# Patient Record
Sex: Male | Born: 1935 | Race: White | Hispanic: No | Smoking: Former smoker
Health system: Southern US, Community
[De-identification: ages and names within clinical notes are randomized; demographics above are authoritative.]

## PROBLEM LIST (undated history)

## (undated) HISTORY — PX: CHOLECYSTECTOMY: SHX55

---

## 2002-06-12 ENCOUNTER — Inpatient Hospital Stay (HOSPITAL_COMMUNITY): Admission: RE | Admit: 2002-06-12 | Discharge: 2002-06-12 | Payer: Self-pay | Admitting: Neurosurgery

## 2002-06-12 ENCOUNTER — Encounter: Payer: Self-pay | Admitting: Neurosurgery

## 2008-11-03 DIAGNOSIS — C4492 Squamous cell carcinoma of skin, unspecified: Secondary | ICD-10-CM

## 2008-11-03 HISTORY — DX: Squamous cell carcinoma of skin, unspecified: C44.92

## 2010-05-10 ENCOUNTER — Ambulatory Visit: Payer: Self-pay | Admitting: Internal Medicine

## 2010-05-19 ENCOUNTER — Ambulatory Visit: Payer: Self-pay | Admitting: Internal Medicine

## 2011-07-15 ENCOUNTER — Inpatient Hospital Stay: Payer: Self-pay | Admitting: Family Medicine

## 2011-07-15 LAB — COMPREHENSIVE METABOLIC PANEL
Alkaline Phosphatase: 80 U/L (ref 50–136)
BUN: 21 mg/dL — ABNORMAL HIGH (ref 7–18)
Bilirubin,Total: 0.3 mg/dL (ref 0.2–1.0)
Calcium, Total: 9 mg/dL (ref 8.5–10.1)
Creatinine: 1.09 mg/dL (ref 0.60–1.30)
EGFR (Non-African Amer.): >60
SGPT (ALT): 19 U/L

## 2011-07-15 LAB — URINALYSIS, COMPLETE
Bacteria: NONE SEEN
Bilirubin,UR: NEGATIVE
Blood: NEGATIVE
Glucose,UR: 150 mg/dL (ref 0–75)
Ketone: NEGATIVE
Leukocyte Esterase: NEGATIVE
Ph: 5 (ref 4.5–8.0)
Squamous Epithelial: <1

## 2011-07-16 LAB — CBC WITH DIFFERENTIAL/PLATELET
Basophil #: 0 10*3/uL (ref 0.0–0.1)
Eosinophil %: 0.9 %
Lymphocyte #: 0.8 10*3/uL — ABNORMAL LOW (ref 1.0–3.6)
Lymphocyte %: 25.2 %
MCV: 87 fL (ref 80–100)
Monocyte %: 7.9 %
Neutrophil %: 65.6 %
Platelet: 91 10*3/uL — ABNORMAL LOW (ref 150–440)
RDW: 13.6 % (ref 11.5–14.5)
WBC: 3.3 10*3/uL — ABNORMAL LOW (ref 3.8–10.6)

## 2011-07-16 LAB — BASIC METABOLIC PANEL
BUN: 15 mg/dL (ref 7–18)
Calcium, Total: 8.2 mg/dL — ABNORMAL LOW (ref 8.5–10.1)
EGFR (African American): >60
EGFR (Non-African Amer.): >60
Osmolality: 290 (ref 275–301)
Potassium: 3.8 mmol/L (ref 3.5–5.1)

## 2011-07-16 LAB — HEMOGLOBIN: HGB: 8.6 g/dL — ABNORMAL LOW (ref 13.0–18.0)

## 2011-07-17 LAB — CBC WITH DIFFERENTIAL/PLATELET
Basophil #: 0 10*3/uL (ref 0.0–0.1)
Eosinophil #: 0.1 10*3/uL (ref 0.0–0.7)
Lymphocyte %: 20.9 %
MCHC: 34.5 g/dL (ref 32.0–36.0)
Monocyte %: 7.2 %
Platelet: 101 10*3/uL — ABNORMAL LOW (ref 150–440)
RBC: 3.43 10*6/uL — ABNORMAL LOW (ref 4.40–5.90)
RDW: 13.8 % (ref 11.5–14.5)

## 2011-07-17 LAB — BASIC METABOLIC PANEL
BUN: 9 mg/dL (ref 7–18)
Calcium, Total: 8.3 mg/dL — ABNORMAL LOW (ref 8.5–10.1)
EGFR (African American): >60
EGFR (Non-African Amer.): >60
Glucose: 123 mg/dL — ABNORMAL HIGH (ref 65–99)
Osmolality: 287 (ref 275–301)
Potassium: 3.8 mmol/L (ref 3.5–5.1)

## 2011-07-18 LAB — CBC WITH DIFFERENTIAL/PLATELET
Basophil #: 0 10*3/uL (ref 0.0–0.1)
Basophil %: 0.4 %
Eosinophil #: 0.1 10*3/uL (ref 0.0–0.7)
Eosinophil %: 2 %
HCT: 28.8 % — ABNORMAL LOW (ref 40.0–52.0)
HGB: 9.9 g/dL — ABNORMAL LOW (ref 13.0–18.0)
Lymphocyte #: 0.8 10*3/uL — ABNORMAL LOW (ref 1.0–3.6)
MCH: 29.9 pg (ref 26.0–34.0)
MCV: 87 fL (ref 80–100)
Monocyte %: 9.2 %
Neutrophil #: 2.4 10*3/uL (ref 1.4–6.5)
RBC: 3.3 10*6/uL — ABNORMAL LOW (ref 4.40–5.90)
WBC: 3.6 10*3/uL — ABNORMAL LOW (ref 3.8–10.6)

## 2011-07-19 LAB — CBC WITH DIFFERENTIAL/PLATELET
Basophil #: 0 10*3/uL (ref 0.0–0.1)
Basophil %: 0.6 %
Eosinophil %: 1.9 %
HCT: 27.1 % — ABNORMAL LOW (ref 40.0–52.0)
HGB: 9 g/dL — ABNORMAL LOW (ref 13.0–18.0)
Lymphocyte %: 23.2 %
MCH: 29.1 pg (ref 26.0–34.0)
MCHC: 33.3 g/dL (ref 32.0–36.0)
MCV: 87 fL (ref 80–100)
Monocyte #: 0.4 x10 3/mm (ref 0.2–1.0)
RDW: 14.2 % (ref 11.5–14.5)
WBC: 3.9 10*3/uL (ref 3.8–10.6)

## 2011-07-19 LAB — BASIC METABOLIC PANEL
Anion Gap: 7 (ref 7–16)
BUN: 10 mg/dL (ref 7–18)
Co2: 27 mmol/L (ref 21–32)
Creatinine: 1.23 mg/dL (ref 0.60–1.30)
EGFR (African American): >60
Osmolality: 289 (ref 275–301)

## 2011-07-20 LAB — CBC WITH DIFFERENTIAL/PLATELET
Basophil #: 0 10*3/uL (ref 0.0–0.1)
Basophil %: 0.4 %
Eosinophil #: 0.1 10*3/uL (ref 0.0–0.7)
HGB: 9.9 g/dL — ABNORMAL LOW (ref 13.0–18.0)
Lymphocyte %: 24.6 %
MCHC: 35 g/dL (ref 32.0–36.0)
MCV: 86 fL (ref 80–100)
Monocyte %: 9.4 %
Neutrophil #: 2.7 10*3/uL (ref 1.4–6.5)
Neutrophil %: 63.8 %
Platelet: 107 10*3/uL — ABNORMAL LOW (ref 150–440)
RBC: 3.27 10*6/uL — ABNORMAL LOW (ref 4.40–5.90)
WBC: 4.2 10*3/uL (ref 3.8–10.6)

## 2012-11-05 DIAGNOSIS — C4491 Basal cell carcinoma of skin, unspecified: Secondary | ICD-10-CM

## 2012-11-05 HISTORY — DX: Basal cell carcinoma of skin, unspecified: C44.91

## 2014-04-22 NOTE — Discharge Summary (Signed)
PATIENT NAME:  Vincent Church, Vincent Church MR#:  [id number] DATE OF BIRTH:  May 09, 1931  DATE OF ADMISSION:  07/15/2011 DATE OF DISCHARGE:  07/20/2011  DISCHARGE DIAGNOSES:  1. Upper GI bleed secondary to bleeding ulcer, resolved.  2. Anemia.   DISCHARGE MEDICATIONS:  1. Protonix 40 mg p.o. b.i.d.  2. Ferrous sulfate 325 mg p.o. b.i.d. to obtain over-the-counter.   CONSULT: GI per Dr. Dionne Milo    PROCEDURE: The patient had endoscopy performed with gastric ulcer cauterization per Dr. Dionne Milo.   PERTINENT LABS PRIOR TO DISCHARGE: White blood cell count 4.2, hemoglobin 9.9, platelets 107, MCV of 86.   BRIEF HOSPITAL COURSE: The patient initially came in with melena and anemia. The patient was evaluated by GI, had endoscopy performed which showed ulcer that was bleeding and cauterization was performed. Bleeding has resolved. He was placed on PPIs and has been transitioned to Protonix 40 mg p.o. b.i.d. His anemia has remained stable after the procedure. His hemoglobin upon discharge was 9.9. Will follow-up with GI per Dr. Dionne Milo in four weeks and follow-up with Dr. Doy Hutching in two weeks.   DISPOSITION: Stable condition. He will be discharged to home with follow-up as stated above.   ____________________________ Dion Body, MD kl:drc D: 07/20/2011 07:56:41 ET T: 07/20/2011 12:40:48 ET JOB#: [id number]  cc: Dion Body, MD, <Dictator>, Jill Side, MD, Leonie Douglas. Doy Hutching, MD Dion Body MD ELECTRONICALLY SIGNED 08/04/2011 8:31

## 2014-04-22 NOTE — Consult Note (Signed)
Chief Complaint:   Subjective/Chief Complaint Feels well. No BM.   VITAL SIGNS/ANCILLARY NOTES: **Vital Signs.:   16-Jul-13 13:37   Vital Signs Type Routine   Temperature Temperature (F) 97.7   Celsius 36.5   Temperature Source Oral   Pulse Pulse 80   Respirations Respirations 20   Systolic BP Systolic BP 552   Diastolic BP (mmHg) Diastolic BP (mmHg) 63   Mean BP 76   Pulse Ox % Pulse Ox % 94   Pulse Ox Activity Level  At rest   Oxygen Delivery Room Air/ 21 %   Lab Results: Routine Chem:  16-Jul-13 04:55    Glucose, Serum  116   BUN 10   Creatinine (comp) 1.23   Sodium, Serum 145   Potassium, Serum 3.6   Chloride, Serum  111   CO2, Serum 27   Calcium (Total), Serum  8.4   Anion Gap 7   Osmolality (calc) 289   eGFR (African American) >60   eGFR (Non-African American)  56 (eGFR values <10m/min/1.73 m2 may be an indication of chronic kidney disease (CKD). Calculated eGFR is useful in patients with stable renal function. The eGFR calculation will not be reliable in acutely ill patients when serum creatinine is changing rapidly. It is not useful in  patients on dialysis. The eGFR calculation may not be applicable to patients at the low and high extremes of body sizes, pregnant women, and vegetarians.)  Routine Hem:  16-Jul-13 04:55    WBC (CBC) 3.9   RBC (CBC)  3.10   Hemoglobin (CBC)  9.0   Hematocrit (CBC)  27.1   Platelet Count (CBC)  96   MCV 87   MCH 29.1   MCHC 33.3   RDW 14.2   Neutrophil % 64.7   Lymphocyte % 23.2   Monocyte % 9.6   Eosinophil % 1.9   Basophil % 0.6   Neutrophil # 2.5   Lymphocyte #  0.9   Monocyte # 0.4   Eosinophil # 0.1   Basophil # 0.0 (Result(s) reported on 19 Jul 2011 at 06:11AM.)   Assessment/Plan:  Assessment/Plan:   Assessment GI bleed secondary to gastric ulcer. No signs of active GI bleed.    Plan May go home in am if no signs of active GI bleed. DC on bid PPI and iron replacement. Follow up with e in 4 weeks.    Electronic Signatures: IJill Side(MD)  (Signed 16-Jul-13 18:24)  Authored: Chief Complaint, VITAL SIGNS/ANCILLARY NOTES, Lab Results, Assessment/Plan   Last Updated: 16-Jul-13 18:24 by IJill Side(MD)

## 2014-04-22 NOTE — Consult Note (Signed)
Chief Complaint:   Subjective/Chief Complaint Feels well. No melena.   VITAL SIGNS/ANCILLARY NOTES: **Vital Signs.:   15-Jul-13 04:55   Vital Signs Type Routine   Temperature Temperature (F) 98.3   Celsius 36.8   Temperature Source oral   Pulse Pulse 71   Respirations Respirations 20   Systolic BP Systolic BP 621   Diastolic BP (mmHg) Diastolic BP (mmHg) 68   Mean BP 86   Pulse Ox % Pulse Ox % 96   Pulse Ox Activity Level  At rest   Oxygen Delivery Room Air/ 21 %   Lab Results: Routine Hem:  15-Jul-13 07:25    WBC (CBC)  3.6   RBC (CBC)  3.30   Hemoglobin (CBC)  9.9   Hematocrit (CBC)  28.8   Platelet Count (CBC)  106   MCV 87   MCH 29.9   MCHC 34.3   RDW 13.8   Neutrophil % 65.3   Lymphocyte % 23.1   Monocyte % 9.2   Eosinophil % 2.0   Basophil % 0.4   Neutrophil # 2.4   Lymphocyte #  0.8   Monocyte # 0.3   Eosinophil # 0.1   Basophil # 0.0 (Result(s) reported on 18 Jul 2011 at 08:35AM.)   Assessment/Plan:  Assessment/Plan:   Assessment UGI bleed secondary to duodenal ulcer s/p endoscopic therapy. No signs of bleeding and H and H is stable.    Plan Will advance diet. Probable DC in 24-48 hours if no signs of active bleeding.   Electronic Signatures: Jill Side (MD)  (Signed 15-Jul-13 18:42)  Authored: Chief Complaint, VITAL SIGNS/ANCILLARY NOTES, Lab Results, Assessment/Plan   Last Updated: 15-Jul-13 18:42 by Jill Side (MD)

## 2014-04-27 NOTE — Consult Note (Signed)
Chief Complaint:   Subjective/Chief Complaint EGD done. Single gastric ulcer with visible vessel. Treated with epinephrine injection and treated with heater probe with good results.  Recommendations: Observe for another 48 hours. Clear liquid diet, may advance tomorrow if no signs of active bleeding. Continue PPI infusion. H pylori antibody test ordered. Please treat if positive. Will follow.   Electronic Signatures: Jill Side (MD)  (Signed 14-Jul-13 08:35)  Authored: Chief Complaint   Last Updated: 14-Jul-13 08:35 by Jill Side (MD)

## 2014-04-27 NOTE — Consult Note (Signed)
Brief Consult Note: Diagnosis: GI bleed.   Patient was seen by consultant.   Comments: Melena, resolved. Most likely secondary to NSAID induced gastric mucosal damage. Anemia, stable.  Recommendations: Continue PPI infusion. Clear liquid diet. EGD in am. Further recommendations to follow.  Electronic Signatures: Jill Side (MD)  (Signed 13-Jul-13 13:18)  Authored: Brief Consult Note   Last Updated: 13-Jul-13 13:18 by Jill Side (MD)

## 2014-04-27 NOTE — Consult Note (Signed)
PATIENT NAME:  Vincent Church, Vincent Church MR#:  [id number] DATE OF BIRTH:  October 08, 1931  DATE OF CONSULTATION:  07/17/2011  REFERRING PHYSICIAN:  Fulton Reek, MD  CONSULTING PHYSICIAN:  Jill Side, MD  REASON FOR CONSULTATION: Melena.  HISTORY OF PRESENT ILLNESS: The patient is a 81 year old male with hyperlipidemia, benign prostatic hypertrophy, and borderline diabetes who was in his normal state of health until about two days ago when he noticed dark black stool. According to him he has taken a few Advil over the last few weeks. He was also having some epigastric discomfort. His hemoglobin was found to be low at 9.3 even though his hemoglobin in May of 2013 was normal. He came to the Emergency Room and was subsequently admitted with a diagnosis of upper GI bleed. The patient was evaluated yesterday. He did not have any melena yesterday. His hemoglobin dropped down to 8.3. No hematemesis or vomiting.   PAST MEDICAL HISTORY:  1. History of gastroesophageal reflux disease.  2. Chronic back pain. 3. Benign prostatic hypertrophy. 4. Hyperlipidemia. 5. Diet controlled diabetes.   PAST SURGICAL HISTORY:  1. Tonsillectomy. 2. Lumbar laminectomy. 3. Hydrocele repair. 4. Cholecystectomy.   ALLERGIES: None.   MEDICATIONS AT HOME: He has taken only a few tablets of Advil over the last few weeks and has been using TUMS recently for epigastric discomfort.   SOCIAL HISTORY: Does not smoke or drink.   FAMILY HISTORY: Unremarkable.   REVIEW OF SYSTEMS: Negative except for what is mentioned in the history of present illness.   PHYSICAL EXAMINATION:   GENERAL: Well built male who appears somewhat pale, does not appear to be in any acute distress. He was hemodynamically stable on admission although he was somewhat tachycardic with a heart rate of around 90 to 100.   NECK: Neck veins are flat.   LUNGS: Grossly clear to auscultation bilaterally with fair air entry.   CARDIOVASCULAR: Regular rate and  rhythm. No gallops or murmur.   ABDOMEN: Quite soft and benign. Mild epigastric tenderness was noted. There is no rebound or guarding. No hepatosplenomegaly was noted. No ascites.   EXTREMITIES: No edema.   NEUROLOGIC: Appears to be unremarkable.   LABORATORY, DIAGNOSTIC, AND RADIOLOGICAL DATA: As mentioned above, hemoglobin was 8.6 initially, currently is 10.4. Platelet count is 101 which is slightly lower than normal. White cell count is 3.8. His platelet count has been somewhat low since admission. Serum lipase 239. Electrolytes are fine. Liver enzymes are normal. BUN 21. Creatinine 1.09.   ASSESSMENT AND PLAN: The patient is with a history of nonsteroidal use now presenting with epigastric pain and melena highly consistent with gastric injury from nonsteroidal use. The patient was started on IV PPI. An upper GI endoscopy was recommended with which he agreed. An upper GI endoscopy was done this morning which showed a prepyloric ulcer with a visible vessel. Ulcer was treated with epinephrine injection and heater probe with good results.  1. Will continue to monitor him for at least another 48 hours.  2. Continue IV PPI.  3. Clear liquid diet, will advance gradually.      4. Follow hemoglobin and hematocrit and transfuse if needed.  5. Further recommendations to follow.   ____________________________ Jill Side, MD si:drc D: 07/17/2011 08:40:21 ET T: 07/17/2011 11:03:15 ET JOB#: [id number]  cc: Jill Side, MD, <Dictator> Jill Side MD ELECTRONICALLY SIGNED 08/05/2011 13:22

## 2014-04-27 NOTE — H&P (Signed)
PATIENT NAME:  Vincent Church, Vincent Church Vincent#:  [id number] DATE OF BIRTH:  04/16/1931  DATE OF ADMISSION:  07/15/2011  ADMITTING PHYSICIAN: Gladstone Lighter, MD  PRIMARY CARE PHYSICIAN: Dr. Fulton Reek.     CHIEF COMPLAINT: Melena.  HISTORY OF PRESENT ILLNESS: Vincent Church is a pleasant 81 year old Caucasian male with no significant past medical history other than gastroesophageal reflux problem and some chronic back pain and diet-controlled diabetes mellitus who went to [facility] today because of melenic stools for the last couple of days. Patient denies any bright red blood per rectum. He has taken a few Advil over the last week secondary to back pain, which he normally does not do. He has also been having some epigastric discomfort with right upper quadrant pain over the last week. He has not had prior episodes of melena. His hemoglobin done today at the walk-in clinic was 9.3 whereas his hemoglobin was 13 in May 2013 from old labs. He has not had any more bloody stools today and presents to the ER. Because of the drop in hemoglobin with melenic stools, he is being admitted.   PAST MEDICAL HISTORY:  1. Gastroesophageal reflux disease with history of esophageal spasms.  2. Chronic back pain.  3. Benign prostatic hypertrophy.  4. Hyperlipidemia.  5. Borderline diet-controlled diabetes mellitus.   PAST SURGICAL HISTORY:  1. Tonsillectomy.  2. Lumbar laminectomy.  3. Hydrocele repair.  4. Cholecystectomy.   ALLERGIES: No known drug allergies.   MEDICATIONS AT HOME: Does not take any medications at all at home.  He just took 2 Tums over the last couple of days for epigastric discomfort and took 4 tablets of Advil in the last week for back pain.   SOCIAL HISTORY: Lives at home by himself. No history of any alcohol or smoking.   FAMILY HISTORY: Father died at age 73 probably from heart disease, and mother died in her 24s.  She has history of breast cancer.  REVIEW OF SYSTEMS: CONSTITUTIONAL:  No fever, fatigue, or weakness. EYES: No blurred vision, double vision, glaucoma or cataracts. ENT: No tinnitus, ear pain, epistaxis or discharge. RESPIRATORY: No cough, wheeze, hemoptysis, or chronic obstructive pulmonary disease. CARDIOVASCULAR: No chest pain, orthopnea, arrhythmia, palpitations, or syncope.  GI: No nausea, vomiting, diarrhea, abdominal pain. No hematemesis. Positive for melena. GENITOURINARY: No dysuria, hematuria, renal calculus or urine incontinence. ENDOCRINE: No polyuria, nocturia, thyroid problems, heat or cold intolerance. HEMATOLOGY: No anemia, easy bruising or bleeding. SKIN: No acne, rash, or lesions.  MUSCULOSKELETAL: No neck, back, shoulder pain, arthritis, or gout. NEUROLOGIC: No numbness, weakness, cerebrovascular accident, transient ischemic attack, or seizures. PSYCHOLOGICAL: No anxiety, insomnia, or depression.   PHYSICAL EXAMINATION:  VITAL SIGNS: Temperature is 97.8 degrees Fahrenheit, pulse 98, respirations 20, blood pressure 140/65, pulse oximetry 100% on room air.   GENERAL: Well-built, well-nourished man lying in bed, not in any acute distress.   HEENT: Normocephalic, atraumatic. Pupils equal, round, reacting to light. Anicteric sclerae. Extraocular movements intact. Oropharynx clear without erythema, mass, or exudates.   NECK: Supple. No thyromegaly, JVD, or carotid bruits. No lymphadenopathy.   LUNGS: Clear to auscultation. No wheeze or crackles. No use of accessory muscles for breathing.   CARDIOVASCULAR: S1, S2, regular rate and rhythm. No murmurs, rubs, or gallops.   ABDOMEN: Soft, nontender, nondistended. No hepatosplenomegaly. Normal bowel sounds.   EXTREMITIES: No pedal edema. No clubbing or cyanosis; 2+ dorsalis pedis pulses palpable bilaterally.   SKIN: No acne, rash, or lesions.   LYMPHATICS: No cervical or inguinal  lymphadenopathy.   NEUROLOGIC: Cranial nerves intact. No focal motor or sensory deficits.   PSYCHOLOGICAL: The patient is  awake, alert, oriented x3.   LABORATORY DATA:  1. His CBC done over at the walk-in clinic shows WBC 4.3, hemoglobin 9.3, hematocrit 27.9, platelet count is 119,000.  2. Sodium 144, potassium 3.9, chloride 108, bicarbonate 29, BUN 21, creatinine 1.09. Glucose of 169, calcium of 9.0.  3. ALT 19, AST 15, alkaline phosphatase 80, total bilirubin 0.3, albumin of 3.6. Lipase 239.  4. Urinalysis negative for any infection.  5. EKG showing normal sinus rhythm, heart rate of 74.     ASSESSMENT AND PLAN: A 81 year old man with history of chronic back pain, diet-controlled diabetes, admitted for melena for 2 days and drop in hemoglobin from 13 to 9.3 in the last 2 months.   1. Melena with epigastric discomfort, likely upper gastrointestinal bleed. The patient also had taken some Advil for back pain in the last week, could be contributing factor. We will admit, start Protonix drip, order q.6 hour hemoglobin, and get gastrointestinal consult. Vitals are stable at this time. Start clear-liquid diet for now and also IV fluids and likely upper GI endoscopy based on GI evaluation.  2. Chronic back pain. Appears stable at this time. Would avoid NSAIDs. 3. Diet-controlled diabetes mellitus. Continue to monitor this, especially patient will only be on liquid diet at this time.            CODE STATUS: FULL CODE.   TIME SPENT ON ADMISSION: 50 minutes.   ____________________________ Gladstone Lighter, MD rk:vtd D: 07/15/2011 19:17:34 ET T: 07/16/2011 07:00:47 ET JOB#: [id number]  cc: Gladstone Lighter, MD, <Dictator> Leonie Douglas. Doy Hutching, MD Gladstone Lighter MD ELECTRONICALLY SIGNED 08/01/2011 15:11

## 2015-11-18 ENCOUNTER — Other Ambulatory Visit: Payer: Self-pay | Admitting: Internal Medicine

## 2015-11-18 DIAGNOSIS — R55 Syncope and collapse: Secondary | ICD-10-CM

## 2015-11-27 ENCOUNTER — Ambulatory Visit
Admission: RE | Admit: 2015-11-27 | Discharge: 2015-11-27 | Disposition: A | Discharge status: Home / Self Care | Payer: Medicare Other | Source: Ambulatory Visit | Attending: Internal Medicine | Admitting: Internal Medicine

## 2015-11-27 DIAGNOSIS — R55 Syncope and collapse: Secondary | ICD-10-CM | POA: Diagnosis present

## 2017-05-21 IMAGING — CT CT HEAD W/O CM
4 series · 16 of 47 positions shown, 18 images · non-contrast
Comparison: None

CLINICAL DATA: Syncopal episode 1.5 weeks ago, struck back of head
when he fell, loss of consciousness for about 1 minutes

EXAM:
CT HEAD WITHOUT CONTRAST
TECHNIQUE: Contiguous axial images were obtained from the base of the skull
through the vertex without intravenous contrast.

[Series 2: head wo · axial · 0.44mm/px · z∈[+23,+133]mm · 7 of 30 slices shown, 9 images]
[im 4/30  brain]
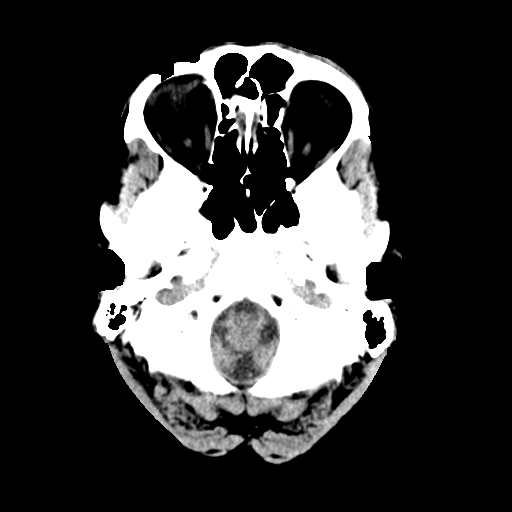
[im 4/30  bone]
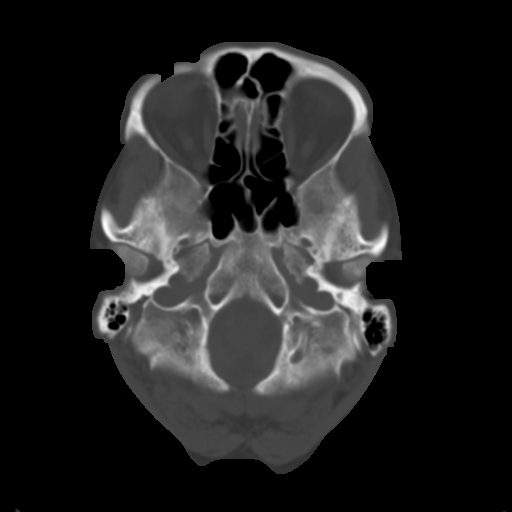
[im 8/30  brain]
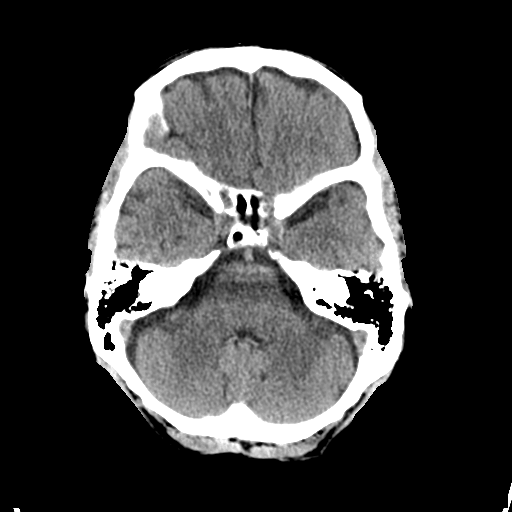
[im 11/30  brain]
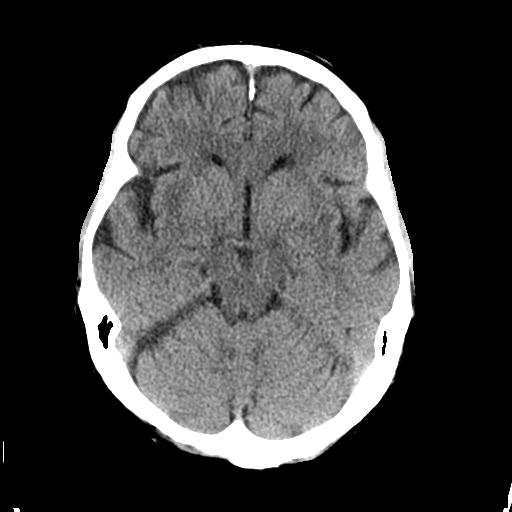
[im 15/30  brain]
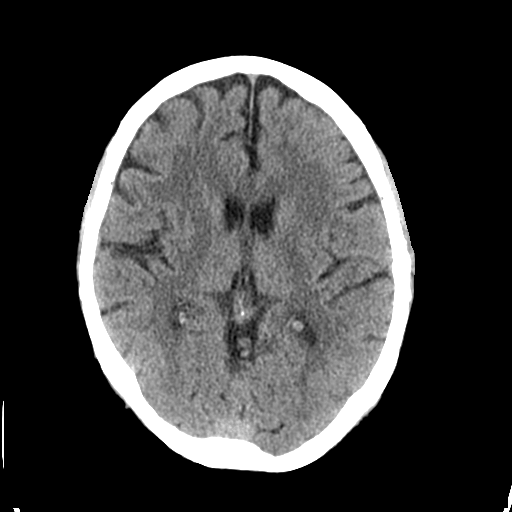
[im 19/30  brain]
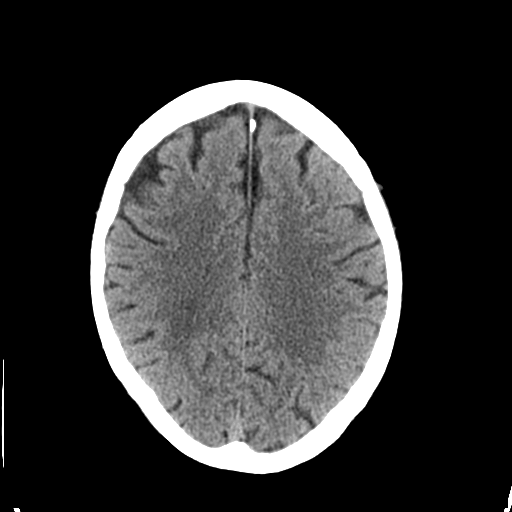
[im 19/30  bone]
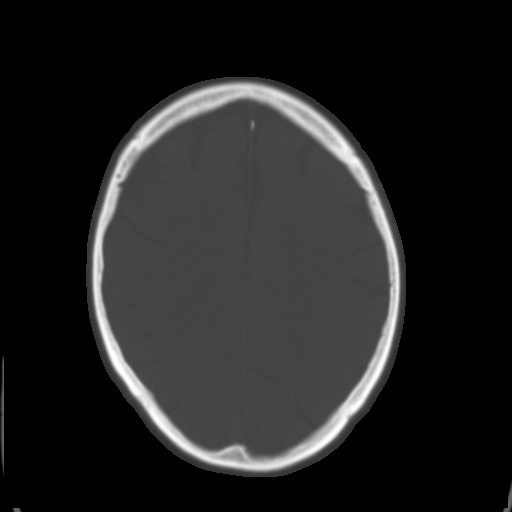
[im 22/30  brain]
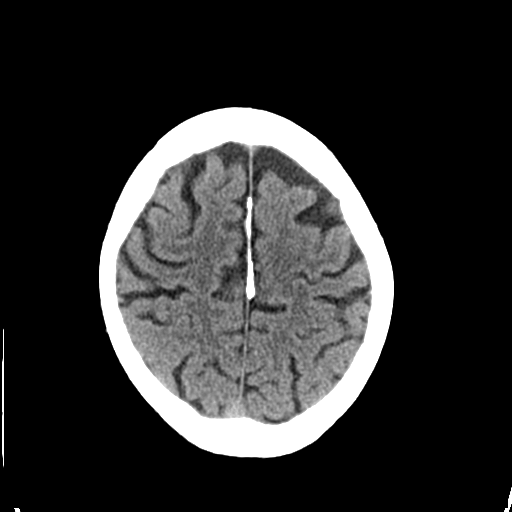
[im 26/30  brain]
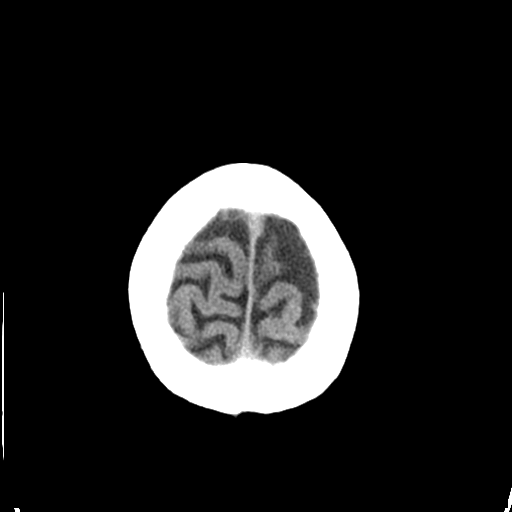

[Series 3: head bone · axial · 0.44mm/px · z∈[+22,+52]mm · 3 of 75 slices shown]
[im 8/75  bone]
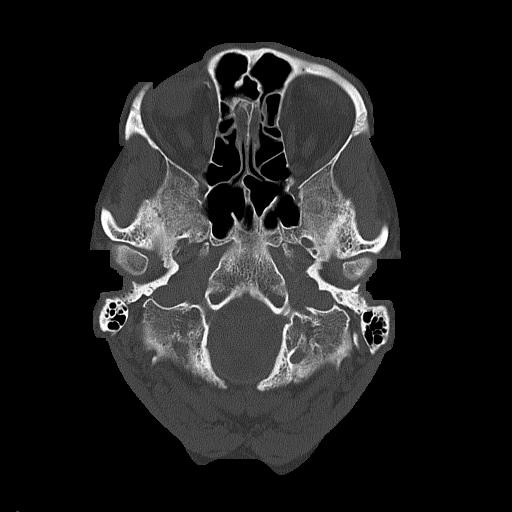
[im 15/75  bone]
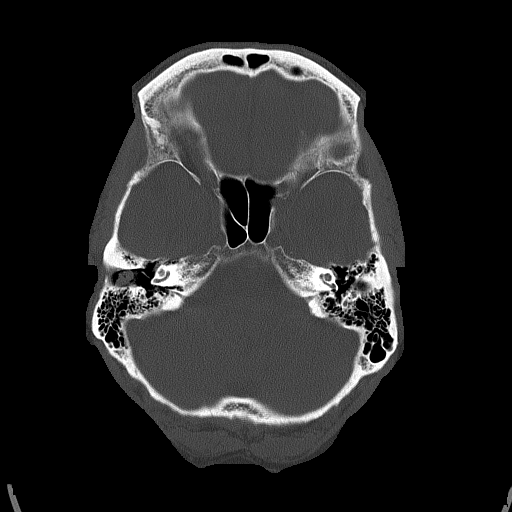
[im 23/75  bone]
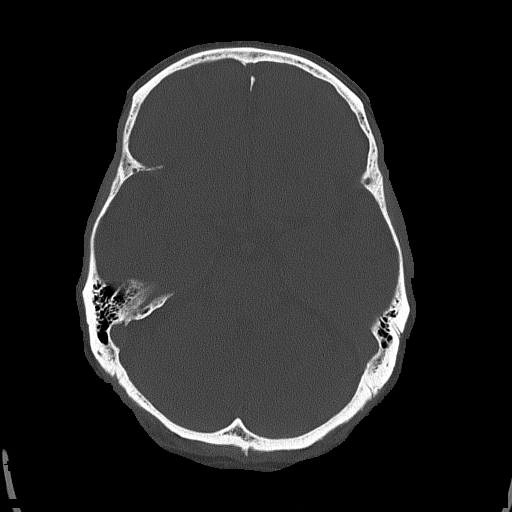

[Series 4: coronal soft tissue · coronal · 0.32mm/px · 3 of 67 slices shown]
[im 23/67  brain]
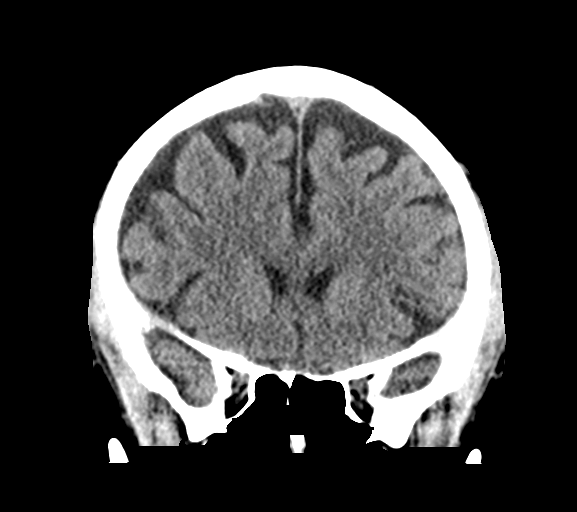
[im 30/67  brain]
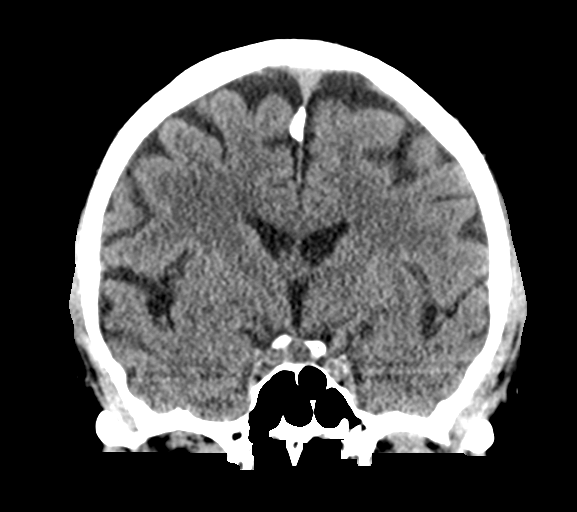
[im 37/67  brain]
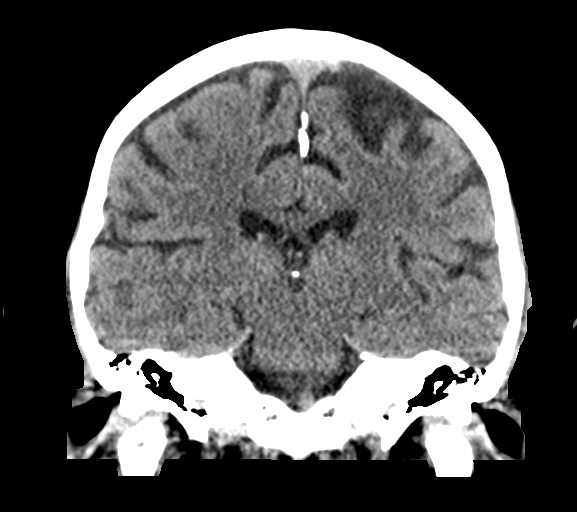

[Series 5: sagittal soft tissue · sagittal · 0.33mm/px · 3 of 55 slices shown]
[im 19/55  brain]
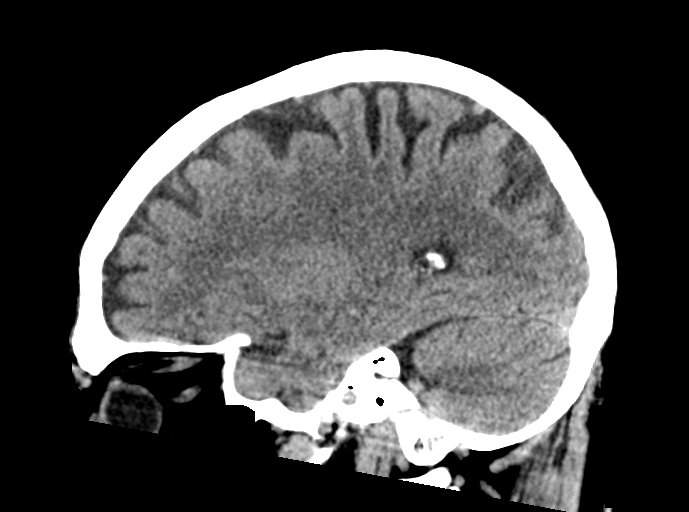
[im 28/55  brain]
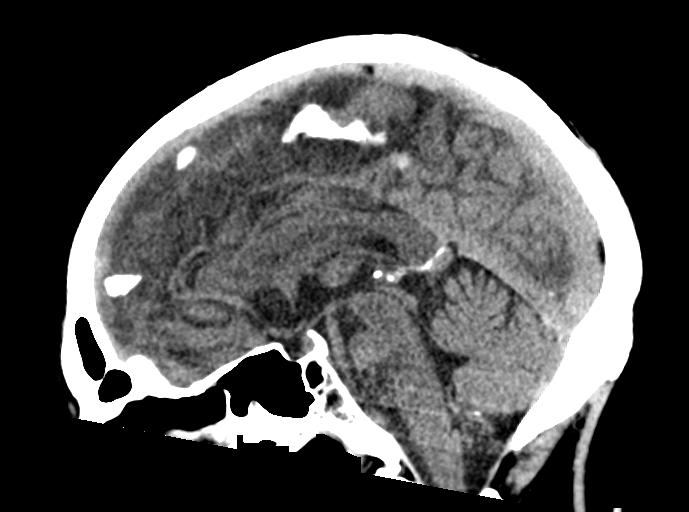
[im 37/55  brain]
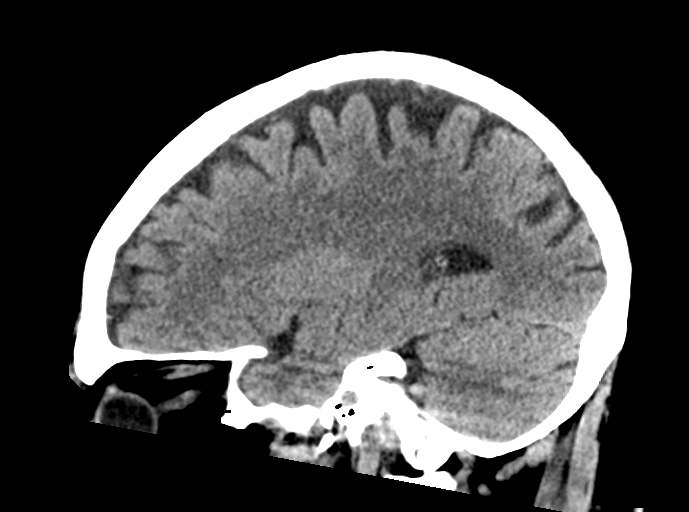

[16 of 47 positions shown; findings below may reference images not displayed]

FINDINGS: Brain: Normal ventricular morphology. No midline shift or mass
effect. Normal appearance of brain parenchyma. No intracranial
hemorrhage, mass lesion, evidence of acute infarction, or
extra-axial fluid collection. Calcification of falx.

Vascular: Unremarkable

Skull: Intact

Sinuses/Orbits: Clear

Other: Probable cerumen in RIGHT external auditory canal.
IMPRESSION: No acute intracranial abnormalities.

## 2018-04-09 DIAGNOSIS — C4492 Squamous cell carcinoma of skin, unspecified: Secondary | ICD-10-CM

## 2018-04-09 HISTORY — DX: Squamous cell carcinoma of skin, unspecified: C44.92

## 2020-12-09 ENCOUNTER — Encounter: Payer: Self-pay | Admitting: Dermatology

## 2020-12-09 ENCOUNTER — Other Ambulatory Visit: Payer: Self-pay

## 2020-12-09 ENCOUNTER — Ambulatory Visit (INDEPENDENT_AMBULATORY_CARE_PROVIDER_SITE_OTHER): Payer: Medicare Other | Admitting: Dermatology

## 2020-12-09 DIAGNOSIS — D229 Melanocytic nevi, unspecified: Secondary | ICD-10-CM

## 2020-12-09 DIAGNOSIS — Z1283 Encounter for screening for malignant neoplasm of skin: Secondary | ICD-10-CM

## 2020-12-09 DIAGNOSIS — L578 Other skin changes due to chronic exposure to nonionizing radiation: Secondary | ICD-10-CM

## 2020-12-09 DIAGNOSIS — L814 Other melanin hyperpigmentation: Secondary | ICD-10-CM

## 2020-12-09 DIAGNOSIS — L821 Other seborrheic keratosis: Secondary | ICD-10-CM

## 2020-12-09 DIAGNOSIS — L570 Actinic keratosis: Secondary | ICD-10-CM | POA: Diagnosis not present

## 2020-12-09 DIAGNOSIS — D1800 Hemangioma unspecified site: Secondary | ICD-10-CM

## 2020-12-09 DIAGNOSIS — Z85828 Personal history of other malignant neoplasm of skin: Secondary | ICD-10-CM | POA: Diagnosis not present

## 2020-12-09 DIAGNOSIS — L820 Inflamed seborrheic keratosis: Secondary | ICD-10-CM | POA: Diagnosis not present

## 2020-12-09 NOTE — Progress Notes (Signed)
Follow-Up Visit   Subjective  Vincent Church is a 87 y.o. male who presents for the following: UBSE  (Patient has numerous irritated and itchy skin lesions scattered on the face, trunk, and extremities that he would like treated today. He has a hx of SCC.).  The patient presents for Upper Body Skin Exam (UBSE) for skin cancer screening and mole check.  The patient has spots, moles and lesions to be evaluated, some may be new or changing and the patient has concerns that these could be cancer.  The following portions of the chart were reviewed this encounter and updated as appropriate:   Allergies  Meds  Problems  Med Hx  Surg Hx  Fam Hx     Review of Systems:  No other skin or systemic complaints except as noted in HPI or Assessment and Plan.  Objective  Well appearing patient in no apparent distress; mood and affect are within normal limits.  All skin waist up examined.  Chest, neck, face x 28 (28) Erythematous keratotic or waxy stuck-on papule or plaque.   Face and ears x 16 (16) Erythematous thin papules/macules with gritty scale.    Assessment & Plan  Inflamed seborrheic keratosis Chest, neck, face x 28  Destruction of lesion - Chest, neck, face x 28 Complexity: simple   Destruction method: cryotherapy   Informed consent: discussed and consent obtained   Timeout:  patient name, date of birth, surgical site, and procedure verified Lesion destroyed using liquid nitrogen: Yes   Region frozen until ice ball extended beyond lesion: Yes   Outcome: patient tolerated procedure well with no complications   Post-procedure details: wound care instructions given    AK (actinic keratosis) (16) Face and ears x 16  Destruction of lesion - Face and ears x 16 Complexity: simple   Destruction method: cryotherapy   Informed consent: discussed and consent obtained   Timeout:  patient name, date of birth, surgical site, and procedure verified Lesion destroyed using liquid  nitrogen: Yes   Region frozen until ice ball extended beyond lesion: Yes   Outcome: patient tolerated procedure well with no complications   Post-procedure details: wound care instructions given    Skin cancer screening  Lentigines - Scattered tan macules - Due to sun exposure - Benign-appearing, observe - Recommend daily broad spectrum sunscreen SPF 30+ to sun-exposed areas, reapply every 2 hours as needed. - Call for any changes  Seborrheic Keratoses - Stuck-on, waxy, tan-brown papules and/or plaques  - Benign-appearing - Discussed benign etiology and prognosis. - Observe - Call for any changes  Melanocytic Nevi - Tan-brown and/or pink-flesh-colored symmetric macules and papules - Benign appearing on exam today - Observation - Call clinic for new or changing moles - Recommend daily use of broad spectrum spf 30+ sunscreen to sun-exposed areas.   Hemangiomas - Red papules - Discussed benign nature - Observe - Call for any changes  Actinic Damage - Chronic condition, secondary to cumulative UV/sun exposure - diffuse scaly erythematous macules with underlying dyspigmentation - Recommend daily broad spectrum sunscreen SPF 30+ to sun-exposed areas, reapply every 2 hours as needed.  - Staying in the shade or wearing long sleeves, sun glasses (UVA+UVB protection) and wide brim hats (4-inch brim around the entire circumference of the hat) are also recommended for sun protection.  - Call for new or changing lesions.  History of Squamous Cell Carcinoma of the Skin - No evidence of recurrence today - No lymphadenopathy - Recommend regular full body  skin exams - Recommend daily broad spectrum sunscreen SPF 30+ to sun-exposed areas, reapply every 2 hours as needed.  - Call if any new or changing lesions are noted between office visits  Skin cancer screening performed today.  Return in about 2 months (around 02/09/2021) for ISK and AK f/u .  Luther Redo, CMA, am acting as  scribe for Sarina Ser, MD . Documentation: I have reviewed the above documentation for accuracy and completeness, and I agree with the above.  Sarina Ser, MD

## 2020-12-09 NOTE — Patient Instructions (Signed)

## 2020-12-18 ENCOUNTER — Encounter: Payer: Self-pay | Admitting: Dermatology

## 2021-02-10 ENCOUNTER — Ambulatory Visit (INDEPENDENT_AMBULATORY_CARE_PROVIDER_SITE_OTHER): Payer: Medicare Other | Admitting: Dermatology

## 2021-02-10 ENCOUNTER — Other Ambulatory Visit: Payer: Self-pay

## 2021-02-10 DIAGNOSIS — L578 Other skin changes due to chronic exposure to nonionizing radiation: Secondary | ICD-10-CM

## 2021-02-10 DIAGNOSIS — L570 Actinic keratosis: Secondary | ICD-10-CM

## 2021-02-10 DIAGNOSIS — L820 Inflamed seborrheic keratosis: Secondary | ICD-10-CM

## 2021-02-10 DIAGNOSIS — L821 Other seborrheic keratosis: Secondary | ICD-10-CM | POA: Diagnosis not present

## 2021-02-10 DIAGNOSIS — D492 Neoplasm of unspecified behavior of bone, soft tissue, and skin: Secondary | ICD-10-CM

## 2021-02-10 DIAGNOSIS — C44622 Squamous cell carcinoma of skin of right upper limb, including shoulder: Secondary | ICD-10-CM

## 2021-02-10 MED ORDER — FLUOROURACIL 5 % EX CREA: TOPICAL_CREAM | Freq: Two times a day (BID) | CUTANEOUS | 1 refills | Status: DC

## 2021-02-10 NOTE — Patient Instructions (Addendum)
Wound Care Instructions  Cleanse wound gently with soap and water once a day then pat dry with clean gauze. Apply a thing coat of Petrolatum (petroleum jelly, "Vaseline") over the wound (unless you have an allergy to this). We recommend that you use a new, sterile tube of Vaseline. Do not pick or remove scabs. Do not remove the yellow or white "healing tissue" from the base of the wound.  Cover the wound with fresh, clean, nonstick gauze and secure with paper tape. You may use Band-Aids in place of gauze and tape if the would is small enough, but would recommend trimming much of the tape off as there is often too much. Sometimes Band-Aids can irritate the skin.  You should call the office for your biopsy report after 1 week if you have not already been contacted.  If you experience any problems, such as abnormal amounts of bleeding, swelling, significant bruising, significant pain, or evidence of infection, please call the office immediately.  FOR ADULT SURGERY PATIENTS: If you need something for pain relief you may take 1 extra strength Tylenol (acetaminophen) AND 2 Ibuprofen (200mg  each) together every 4 hours as needed for pain. (do not take these if you are allergic to them or if you have a reason you should not take them.) Typically, you may only need pain medication for 1 to 3 days.        If You Need Anything After Your Visit  If you have any questions or concerns for your doctor, please call our main line at [phone] and press option 4 to reach your doctor's medical assistant. If no one answers, please leave a voicemail as directed and we will return your call as soon as possible. Messages left after 4 pm will be answered the following business day.   You may also send [place] a message via [place]. We typically respond to MyChart messages within 1-2 business days.  For prescription refills, please ask your pharmacy to contact our office. Our fax number is [phone].  If you have  an urgent issue when the clinic is closed that cannot wait until the next business day, you can page your doctor at the number below.    Please note that while we do our best to be available for urgent issues outside of office hours, we are not available 24/7.   If you have an urgent issue and are unable to reach [place], you may choose to seek medical care at your doctor's office, retail clinic, urgent care center, or emergency room.  If you have a medical emergency, please immediately call 911 or go to the emergency department.  Pager Numbers  - Dr. Nehemiah Massed: [phone]  - Dr. Laurence Ferrari: [phone]  - Dr. Nicole Kindred: [phone]  In the event of inclement weather, please call our main line at [phone] for an update on the status of any delays or closures.  Dermatology Medication Tips: Please keep the boxes that topical medications come in in order to help keep track of the instructions about where and how to use these. Pharmacies typically print the medication instructions only on the boxes and not directly on the medication tubes.   If your medication is too expensive, please contact our office at [phone] option 4 or send [place] a message through [place].   We are unable to tell what your co-pay for medications will be in advance as this is different depending on your insurance coverage. However, we may be able to find a substitute medication at  lower cost or fill out paperwork to get insurance to cover a needed medication.   If a prior authorization is required to get your medication covered by your insurance company, please allow [place] 1-2 business days to complete this process.  Drug prices often vary depending on where the prescription is filled and some pharmacies may offer cheaper prices.  The website www.goodrx.com contains coupons for medications through different pharmacies. The prices here do not account for what the cost may be with help from insurance (it may be cheaper with  your insurance), but the website can give you the price if you did not use any insurance.  - You can print the associated coupon and take it with your prescription to the pharmacy.  - You may also stop by our office during regular business hours and pick up a GoodRx coupon card.  - If you need your prescription sent electronically to a different pharmacy, notify our office through [facility] or by phone at [phone] option 4.     Si Usted Necesita Algo Despus de Su Visita  Tambin puede enviarnos un mensaje a travs de Pharmacist, community. Por lo general respondemos a los mensajes de MyChart en el transcurso de 1 a 2 das hbiles.  Para renovar recetas, por favor pida a su farmacia que se ponga en contacto con nuestra oficina. Harland Dingwall de fax es [place] [phone].  Si tiene un asunto urgente cuando la clnica est cerrada y que no puede esperar hasta el siguiente da hbil, puede llamar/localizar a su doctor(a) al nmero que aparece a continuacin.   Por favor, tenga en cuenta que aunque hacemos todo lo posible para estar disponibles para asuntos urgentes fuera del horario de [place], no estamos disponibles las 24 horas del da, los 7 das de la [place].   Si tiene un problema urgente y no puede comunicarse con nosotros, puede optar por buscar atencin mdica  en el consultorio de su doctor(a), en una clnica privada, en un centro de atencin urgente o en una sala de emergencias.  Si tiene Engineering geologist, por favor llame inmediatamente al 911 o vaya a la sala de emergencias.  Nmeros de bper  - Dr. Nehemiah Massed: [phone]  - Dra. Moye: [phone]  - Dra. Nicole Kindred: [phone]  En caso de inclemencias del [place], por favor llame a Johnsie Kindred principal al [phone] para una actualizacin sobre el [place] de cualquier retraso o cierre.  Consejos para la medicacin en dermatologa: Por favor, guarde las cajas en las que vienen los medicamentos de uso tpico para  ayudarle a seguir las instrucciones sobre dnde y cmo usarlos. Las farmacias generalmente imprimen las instrucciones del medicamento slo en las cajas y no directamente en los tubos del [place].   Si su medicamento es muy caro, por favor, pngase en contacto con Zigmund Daniel llamando al [phone] y presione la opcin 4 o envenos un mensaje a travs de Pharmacist, community.   No podemos decirle cul ser su copago por los medicamentos por adelantado ya que esto es diferente dependiendo de la cobertura de su seguro. Sin embargo, es posible que podamos encontrar un medicamento sustituto a Electrical engineer un formulario para que el seguro cubra el medicamento que se considera necesario.   Si se requiere una autorizacin previa para que su compaa de seguros [place] su medicamento, por favor permtanos de 1 a 2 das hbiles para completar este proceso.  Los precios de los medicamentos varan con frecuencia dependiendo del Environmental consultant de dnde se  surte la receta y alguna farmacias pueden ofrecer precios ms baratos.  El sitio web www.goodrx.com tiene cupones para medicamentos de Airline pilot. Los precios aqu no tienen en cuenta lo que podra costar con la ayuda del seguro (puede ser ms barato con su seguro), pero el sitio web puede darle el precio si no utiliz Research scientist (physical sciences).  - Puede imprimir el cupn correspondiente y llevarlo con su receta a la farmacia.  - Tambin puede pasar por nuestra oficina durante el horario de atencin regular y Charity fundraiser una tarjeta de cupones de GoodRx.  - Si necesita que su receta se enve electrnicamente a Chiropodist, informe a nuestra oficina a travs de MyChart de [place] o por telfono llamando al [phone] y presione la opcin 4.  -Start 5-fluorouracil/calcipotriene cream twice a day for 7 days to affected areas including bilateral temples and cheeks. Prescription sent to [facility].     5-Fluorouracil/Calcipotriene Patient Education    Actinic keratoses are the dry, red scaly spots on the skin caused by sun damage. A portion of these spots can turn into skin cancer with time, and treating them can help prevent development of skin cancer.   Treatment of these spots requires removal of the defective skin cells. There are various ways to remove actinic keratoses, including freezing with liquid nitrogen, treatment with creams, or treatment with a blue light procedure in the office.   5-fluorouracil cream is a topical cream used to treat actinic keratoses. It works by interfering with the growth of abnormal fast-growing skin cells, such as actinic keratoses. These cells peel off and are replaced by healthy ones.   5-fluorouracil/calcipotriene is a combination of the 5-fluorouracil cream with a vitamin D analog cream called calcipotriene. The calcipotriene alone does not treat actinic keratoses. However, when it is combined with 5-fluorouracil, it helps the 5-fluorouracil treat the actinic keratoses much faster so that the same results can be achieved with a much shorter treatment time.  INSTRUCTIONS FOR 5-FLUOROURACIL/CALCIPOTRIENE CREAM:   5-fluorouracil/calcipotriene cream typically only needs to be used for 4-7 days. A thin layer should be applied twice a day to the treatment areas recommended by your physician.   If your physician prescribed you separate tubes of 5-fluourouracil and calcipotriene, apply a thin layer of 5-fluorouracil followed by a thin layer of calcipotriene.   Avoid contact with your eyes, nostrils, and mouth. Do not use 5-fluorouracil/calcipotriene cream on infected or open wounds.   You will develop redness, irritation and some crusting at areas where you have pre-cancer damage/actinic keratoses. IF YOU DEVELOP PAIN, BLEEDING, OR SIGNIFICANT CRUSTING, STOP THE TREATMENT EARLY - you have already gotten a good response and the actinic keratoses should clear up well.  Wash your hands after applying  5-fluorouracil 5% cream on your skin.   A moisturizer or sunscreen with a minimum SPF 30 should be applied each morning.   Once you have finished the treatment, you can apply a thin layer of Vaseline twice a day to irritated areas to soothe and calm the areas more quickly. If you experience significant discomfort, contact your physician.  For some patients it is necessary to repeat the treatment for best results.  SIDE EFFECTS: When using 5-fluorouracil/calcipotriene cream, you may have mild irritation, such as redness, dryness, swelling, or a mild burning sensation. This usually resolves within 2 weeks. The more actinic keratoses you have, the more redness and inflammation you can expect during treatment. Eye irritation has been reported rarely. If this occurs, please let  us know.   If you have any trouble using this cream, please call the office. If you have any other questions about this information, please do not hesitate to ask me before you leave the office.

## 2021-02-10 NOTE — Progress Notes (Signed)
Follow-Up Visit   Subjective  Vincent Church is a 88 y.o. male who presents for the following: Actinic Keratosis (Face, ears, 7m f/u) and ISK (Chest, neck, face, 66m f/u). The patient has spots, moles and lesions to be evaluated, some may be new or changing and the patient has concerns that these could be cancer.  The following portions of the chart were reviewed this encounter and updated as appropriate:   Allergies   Meds   Problems   Med Hx   Surg Hx   Fam Hx      Review of Systems:  No other skin or systemic complaints except as noted in HPI or Assessment and Plan.  Objective  Well appearing patient in no apparent distress; mood and affect are within normal limits.  A focused examination was performed including face, ears, arms, hands. Relevant physical exam findings are noted in the Assessment and Plan.  R prox volar forearm 1.2cm hyperkeratotic pap     ears, face x 18 (18) Pink scaly macules   arms/hands x 21 (21) Stuck on waxy paps with erythema    Assessment & Plan   Actinic Damage - Severe, confluent actinic changes with pre-cancerous actinic keratoses  - Severe, chronic, not at goal, secondary to cumulative UV radiation exposure over time - diffuse scaly erythematous macules and papules with underlying dyspigmentation - Discussed Prescription "Field Treatment" for Severe, Chronic Confluent Actinic Changes with Pre-Cancerous Actinic Keratoses Field treatment involves treatment of an entire area of skin that has confluent Actinic Changes (Sun/ Ultraviolet light damage) and PreCancerous Actinic Keratoses by method of PhotoDynamic Therapy (PDT) and/or prescription Topical Chemotherapy agents such as 5-fluorouracil, 5-fluorouracil/calcipotriene, and/or imiquimod.  The purpose is to decrease the number of clinically evident and subclinical PreCancerous lesions to prevent progression to development of skin cancer by chemically destroying early precancer changes that may or  may not be visible.  It has been shown to reduce the risk of developing skin cancer in the treated area. As a result of treatment, redness, scaling, crusting, and open sores may occur during treatment course. One or more than one of these methods may be used and may have to be used several times to control, suppress and eliminate the PreCancerous changes. Discussed treatment course, expected reaction, and possible side effects. - Recommend daily broad spectrum sunscreen SPF 30+ to sun-exposed areas, reapply every 2 hours as needed.  - Staying in the shade or wearing long sleeves, sun glasses (UVA+UVB protection) and wide brim hats (4-inch brim around the entire circumference of the hat) are also recommended. - Call for new or changing lesions.  -Start 5-fluorouracil/calcipotriene cream twice a day for 7 days to affected areas including bil temples and cheeks. Prescription sent to [facility]. Patient provided with contact information for pharmacy and advised the pharmacy will mail the prescription to their home. Patient provided with handout reviewing treatment course and side effects and advised to call or message [place] on MyChart with any concerns.   Seborrheic Keratoses - Stuck-on, waxy, tan-brown papules and/or plaques  - Benign-appearing - Discussed benign etiology and prognosis. - Observe - Call for any changes  Neoplasm of skin R prox volar forearm  Epidermal / dermal shaving  Lesion diameter (cm):  1.2 Informed consent: discussed and consent obtained   Timeout: patient name, date of birth, surgical site, and procedure verified   Procedure prep:  Patient was prepped and draped in usual sterile fashion Prep type:  Isopropyl alcohol Anesthesia: the lesion was  anesthetized in a standard fashion   Anesthetic:  1% lidocaine w/ epinephrine 1-100,000 buffered w/ 8.4% NaHCO3 Instrument used: flexible razor blade   Hemostasis achieved with: pressure, aluminum chloride and  electrodesiccation   Outcome: patient tolerated procedure well   Post-procedure details: sterile dressing applied and wound care instructions given   Dressing type: bandage, bacitracin and pressure dressing    Destruction of lesion Complexity: extensive   Destruction method: electrodesiccation and curettage   Informed consent: discussed and consent obtained   Timeout:  patient name, date of birth, surgical site, and procedure verified Procedure prep:  Patient was prepped and draped in usual sterile fashion Prep type:  Isopropyl alcohol Anesthesia: the lesion was anesthetized in a standard fashion   Anesthetic:  1% lidocaine w/ epinephrine 1-100,000 buffered w/ 8.4% NaHCO3 Curettage performed in three different directions: Yes   Electrodesiccation performed over the curetted area: Yes   Lesion length (cm):  1.2 Lesion width (cm):  1.2 Margin per side (cm):  0.2 Final wound size (cm):  1.6 Hemostasis achieved with:  pressure, aluminum chloride and electrodesiccation Outcome: patient tolerated procedure well with no complications   Post-procedure details: sterile dressing applied and wound care instructions given   Dressing type: bandage and bacitracin    Specimen 1 - Surgical pathology Differential Diagnosis: D48.5 R/O SCC  Check Margins: No 1.2cm hyperkeratotic pap EDC today  AK (actinic keratosis) (18) ears, face x 18  Destruction of lesion - ears, face x 18 Complexity: simple   Destruction method: cryotherapy   Informed consent: discussed and consent obtained   Timeout:  patient name, date of birth, surgical site, and procedure verified Lesion destroyed using liquid nitrogen: Yes   Region frozen until ice ball extended beyond lesion: Yes   Outcome: patient tolerated procedure well with no complications   Post-procedure details: wound care instructions given    fluorouracil (EFUDEX) 5 % cream - ears, face x 18 Apply topically 2 (two) times daily. Bid for 7 days to bil  temples and cheeks, thin coat  Inflamed seborrheic keratosis (21) arms/hands x 21  Destruction of lesion - arms/hands x 21 Complexity: simple   Destruction method: cryotherapy   Informed consent: discussed and consent obtained   Timeout:  patient name, date of birth, surgical site, and procedure verified Lesion destroyed using liquid nitrogen: Yes   Region frozen until ice ball extended beyond lesion: Yes   Outcome: patient tolerated procedure well with no complications   Post-procedure details: wound care instructions given     Return in about 4 months (around 06/10/2021) for AK f/u.  I, Othelia Pulling, RMA, am acting as scribe for Sarina Ser, MD . Documentation: I have reviewed the above documentation for accuracy and completeness, and I agree with the above.  Sarina Ser, MD

## 2021-02-12 ENCOUNTER — Encounter: Payer: Self-pay | Admitting: Dermatology

## 2021-02-16 ENCOUNTER — Telehealth: Payer: Self-pay

## 2021-02-16 NOTE — Telephone Encounter (Signed)
Discussed biopsy results with pt  °

## 2021-02-16 NOTE — Telephone Encounter (Signed)
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

## 2021-06-23 ENCOUNTER — Ambulatory Visit (INDEPENDENT_AMBULATORY_CARE_PROVIDER_SITE_OTHER): Payer: Medicare Other | Admitting: Dermatology

## 2021-06-23 DIAGNOSIS — L821 Other seborrheic keratosis: Secondary | ICD-10-CM

## 2021-06-23 DIAGNOSIS — L578 Other skin changes due to chronic exposure to nonionizing radiation: Secondary | ICD-10-CM

## 2021-06-23 DIAGNOSIS — L820 Inflamed seborrheic keratosis: Secondary | ICD-10-CM

## 2021-06-23 DIAGNOSIS — L570 Actinic keratosis: Secondary | ICD-10-CM | POA: Diagnosis not present

## 2021-06-23 DIAGNOSIS — Z85828 Personal history of other malignant neoplasm of skin: Secondary | ICD-10-CM | POA: Diagnosis not present

## 2021-06-23 NOTE — Progress Notes (Signed)
Follow-Up Visit   Subjective  Vincent Church is a 88 y.o. male who presents for the following: Actinic Keratosis (S/P 5FU mix BID x 7 days on the cheeks - pt did experience redness while using medication and has noticed that since treatment his cheeks are smoother).  The following portions of the chart were reviewed this encounter and updated as appropriate:   Allergies  Meds  Problems  Med Hx  Surg Hx  Fam Hx     Review of Systems:  No other skin or systemic complaints except as noted in HPI or Assessment and Plan.  Objective  Well appearing patient in no apparent distress; mood and affect are within normal limits.  A focused examination was performed including the face and extremities. Relevant physical exam findings are noted in the Assessment and Plan.  arms x 2, face and ears x 15 (17) Erythematous thin papules/macules with gritty scale.   arms and hands (21) Erythematous stuck-on, waxy papule or plaque   Assessment & Plan  AK (actinic keratosis) (17) arms x 2, face and ears x 15 Destruction of lesion - arms x 2, face and ears x 15 Complexity: simple   Destruction method: cryotherapy   Informed consent: discussed and consent obtained   Timeout:  patient name, date of birth, surgical site, and procedure verified Lesion destroyed using liquid nitrogen: Yes   Region frozen until ice ball extended beyond lesion: Yes   Outcome: patient tolerated procedure well with no complications   Post-procedure details: wound care instructions given    Related Medications fluorouracil (EFUDEX) 5 % cream Apply topically 2 (two) times daily. Bid for 7 days to bil temples and cheeks, thin coat  Inflamed seborrheic keratosis (21) arms and hands Symptomatic, irritating, patient would like treated. Destruction of lesion - arms and hands Complexity: simple   Destruction method: cryotherapy   Informed consent: discussed and consent obtained   Timeout:  patient name, date of birth,  surgical site, and procedure verified Lesion destroyed using liquid nitrogen: Yes   Region frozen until ice ball extended beyond lesion: Yes   Outcome: patient tolerated procedure well with no complications   Post-procedure details: wound care instructions given    Actinic Damage - Severe, confluent actinic changes with pre-cancerous actinic keratoses  - Severe, chronic, not at goal, secondary to cumulative UV radiation exposure over time - diffuse scaly erythematous macules and papules with underlying dyspigmentation - Discussed Prescription "Field Treatment" for Severe, Chronic Confluent Actinic Changes with Pre-Cancerous Actinic Keratoses Field treatment involves treatment of an entire area of skin that has confluent Actinic Changes (Sun/ Ultraviolet light damage) and PreCancerous Actinic Keratoses by method of PhotoDynamic Therapy (PDT) and/or prescription Topical Chemotherapy agents such as 5-fluorouracil, 5-fluorouracil/calcipotriene, and/or imiquimod.  The purpose is to decrease the number of clinically evident and subclinical PreCancerous lesions to prevent progression to development of skin cancer by chemically destroying early precancer changes that may or may not be visible.  It has been shown to reduce the risk of developing skin cancer in the treated area. As a result of treatment, redness, scaling, crusting, and open sores may occur during treatment course. One or more than one of these methods may be used and may have to be used several times to control, suppress and eliminate the PreCancerous changes. Discussed treatment course, expected reaction, and possible side effects. - Recommend daily broad spectrum sunscreen SPF 30+ to sun-exposed areas, reapply every 2 hours as needed.  - Staying in the shade  or wearing long sleeves, sun glasses (UVA+UVB protection) and wide brim hats (4-inch brim around the entire circumference of the hat) are also recommended. - Call for new or changing  lesions. -5-FU calcipotriene twice daily for forehead and temples starting in 1 month  Seborrheic Keratoses - Stuck-on, waxy, tan-brown papules and/or plaques  - Benign-appearing - Discussed benign etiology and prognosis. - Observe - Call for any changes  History of Squamous Cell Carcinoma of the Skin - R prox volar forearm  - No evidence of recurrence today - No lymphadenopathy - Recommend regular full body skin exams - Recommend daily broad spectrum sunscreen SPF 30+ to sun-exposed areas, reapply every 2 hours as needed.  - Call if any new or changing lesions are noted between office visits  Return in about 4 months (around 10/23/2021) for AK follow up .  Luther Redo, CMA, am acting as scribe for Sarina Ser, MD . Documentation: I have reviewed the above documentation for accuracy and completeness, and I agree with the above.  Sarina Ser, MD

## 2021-06-23 NOTE — Patient Instructions (Signed)
Due to recent changes in healthcare laws, you may see results of your pathology and/or laboratory studies on MyChart before the doctors have had a chance to review them. We understand that in some cases there may be results that are confusing or concerning to you. Please understand that not all results are received at the same time and often the doctors may need to interpret multiple results in order to provide you with the best plan of care or course of treatment. Therefore, we ask that you please give us 2 business days to thoroughly review all your results before contacting the office for clarification. Should we see a critical lab result, you will be contacted sooner.   If You Need Anything After Your Visit  If you have any questions or concerns for your doctor, please call our main line at [phone] and press option 4 to reach your doctor's medical assistant. If no one answers, please leave a voicemail as directed and we will return your call as soon as possible. Messages left after 4 pm will be answered the following business day.   You may also send us a message via MyChart. We typically respond to MyChart messages within 1-2 business days.  For prescription refills, please ask your pharmacy to contact our office. Our fax number is [phone].  If you have an urgent issue when the clinic is closed that cannot wait until the next business day, you can page your doctor at the number below.    Please note that while we do our best to be available for urgent issues outside of office hours, we are not available 24/7.   If you have an urgent issue and are unable to reach us, you may choose to seek medical care at your doctor's office, retail clinic, urgent care center, or emergency room.  If you have a medical emergency, please immediately call 911 or go to the emergency department.  Pager Numbers  - Dr. Kowalski: [phone]  - Dr. Moye: [phone]  - Dr. Stewart:  [phone]  In the event of inclement weather, please call our main line at [phone] for an update on the status of any delays or closures.  Dermatology Medication Tips: Please keep the boxes that topical medications come in in order to help keep track of the instructions about where and how to use these. Pharmacies typically print the medication instructions only on the boxes and not directly on the medication tubes.   If your medication is too expensive, please contact our office at [phone] option 4 or send us a message through MyChart.   We are unable to tell what your co-pay for medications will be in advance as this is different depending on your insurance coverage. However, we may be able to find a substitute medication at lower cost or fill out paperwork to get insurance to cover a needed medication.   If a prior authorization is required to get your medication covered by your insurance company, please allow us 1-2 business days to complete this process.  Drug prices often vary depending on where the prescription is filled and some pharmacies may offer cheaper prices.  The website www.goodrx.com contains coupons for medications through different pharmacies. The prices here do not account for what the cost may be with help from insurance (it may be cheaper with your insurance), but the website can give you the price if you did not use any insurance.  - You can print the associated coupon and take it with   your prescription to the pharmacy.  - You may also stop by our office during regular business hours and pick up a GoodRx coupon card.  - If you need your prescription sent electronically to a different pharmacy, notify our office through [facility] MyChart or by phone at [phone] option 4.     Si Usted Necesita Algo Despus de Su Visita  Tambin puede enviarnos un mensaje a travs de MyChart. Por lo general respondemos a los mensajes de MyChart en el transcurso de 1 a 2  das hbiles.  Para renovar recetas, por favor pida a su farmacia que se ponga en contacto con nuestra oficina. Nuestro nmero de fax es el [phone].  Si tiene un asunto urgente cuando la clnica est cerrada y que no puede esperar hasta el siguiente da hbil, puede llamar/localizar a su doctor(a) al nmero que aparece a continuacin.   Por favor, tenga en cuenta que aunque hacemos todo lo posible para estar disponibles para asuntos urgentes fuera del horario de oficina, no estamos disponibles las 24 horas del da, los 7 das de la semana.   Si tiene un problema urgente y no puede comunicarse con nosotros, puede optar por buscar atencin mdica  en el consultorio de su doctor(a), en una clnica privada, en un centro de atencin urgente o en una sala de emergencias.  Si tiene una emergencia mdica, por favor llame inmediatamente al 911 o vaya a la sala de emergencias.  Nmeros de bper  - Dr. Kowalski: [phone]  - Dra. Moye: [phone]  - Dra. Stewart: [phone]  En caso de inclemencias del tiempo, por favor llame a nuestra lnea principal al [phone] para una actualizacin sobre el estado de cualquier retraso o cierre.  Consejos para la medicacin en dermatologa: Por favor, guarde las cajas en las que vienen los medicamentos de uso tpico para ayudarle a seguir las instrucciones sobre dnde y cmo usarlos. Las farmacias generalmente imprimen las instrucciones del medicamento slo en las cajas y no directamente en los tubos del medicamento.   Si su medicamento es muy caro, por favor, pngase en contacto con nuestra oficina llamando al [phone] y presione la opcin 4 o envenos un mensaje a travs de MyChart.   No podemos decirle cul ser su copago por los medicamentos por adelantado ya que esto es diferente dependiendo de la cobertura de su seguro. Sin embargo, es posible que podamos encontrar un medicamento sustituto a menor costo o llenar un formulario para que el  seguro cubra el medicamento que se considera necesario.   Si se requiere una autorizacin previa para que su compaa de seguros cubra su medicamento, por favor permtanos de 1 a 2 das hbiles para completar este proceso.  Los precios de los medicamentos varan con frecuencia dependiendo del lugar de dnde se surte la receta y alguna farmacias pueden ofrecer precios ms baratos.  El sitio web www.goodrx.com tiene cupones para medicamentos de diferentes farmacias. Los precios aqu no tienen en cuenta lo que podra costar con la ayuda del seguro (puede ser ms barato con su seguro), pero el sitio web puede darle el precio si no utiliz ningn seguro.  - Puede imprimir el cupn correspondiente y llevarlo con su receta a la farmacia.  - Tambin puede pasar por nuestra oficina durante el horario de atencin regular y recoger una tarjeta de cupones de GoodRx.  - Si necesita que su receta se enve electrnicamente a una farmacia diferente, informe a nuestra oficina a travs de MyChart de    o por telfono llamando al [phone] y presione la opcin 4.  

## 2021-06-24 ENCOUNTER — Encounter: Payer: Self-pay | Admitting: Dermatology

## 2021-08-25 ENCOUNTER — Encounter: Payer: Self-pay | Admitting: Internal Medicine

## 2021-08-25 ENCOUNTER — Inpatient Hospital Stay: Payer: Medicare Other | Attending: Internal Medicine | Admitting: Internal Medicine

## 2021-08-25 ENCOUNTER — Other Ambulatory Visit: Payer: Self-pay

## 2021-08-25 ENCOUNTER — Inpatient Hospital Stay: Payer: Medicare Other

## 2021-08-25 VITALS — BP 124/68 | HR 69 | Temp 98.2°F | Ht 72.0 in | Wt 196.8 lb

## 2021-08-25 DIAGNOSIS — D61818 Other pancytopenia: Secondary | ICD-10-CM | POA: Insufficient documentation

## 2021-08-25 DIAGNOSIS — D696 Thrombocytopenia, unspecified: Secondary | ICD-10-CM

## 2021-08-25 DIAGNOSIS — Z87891 Personal history of nicotine dependence: Secondary | ICD-10-CM | POA: Insufficient documentation

## 2021-08-25 DIAGNOSIS — Z79899 Other long term (current) drug therapy: Secondary | ICD-10-CM | POA: Insufficient documentation

## 2021-08-25 DIAGNOSIS — D649 Anemia, unspecified: Secondary | ICD-10-CM

## 2021-08-25 LAB — RETICULOCYTES
Immature Retic Fract: 13.3 % (ref 2.3–15.9)
RBC.: 3.51 MIL/uL — ABNORMAL LOW (ref 4.22–5.81)
Retic Count, Absolute: 68.8 10*3/uL (ref 19.0–186.0)
Retic Ct Pct: 2 % (ref 0.4–3.1)

## 2021-08-25 LAB — CBC WITH DIFFERENTIAL/PLATELET
Abs Immature Granulocytes: 0.02 10*3/uL (ref 0.00–0.07)
Basophils Absolute: 0 10*3/uL (ref 0.0–0.1)
Basophils Relative: 1 %
Eosinophils Absolute: 0 10*3/uL (ref 0.0–0.5)
Eosinophils Relative: 0 %
HCT: 32.4 % — ABNORMAL LOW (ref 39.0–52.0)
Hemoglobin: 10.8 g/dL — ABNORMAL LOW (ref 13.0–17.0)
Immature Granulocytes: 1 %
Lymphocytes Relative: 23 %
Lymphs Abs: 0.7 10*3/uL (ref 0.7–4.0)
MCH: 30.8 pg (ref 26.0–34.0)
MCHC: 33.3 g/dL (ref 30.0–36.0)
MCV: 92.3 fL (ref 80.0–100.0)
Monocytes Absolute: 0.2 10*3/uL (ref 0.1–1.0)
Monocytes Relative: 7 %
Neutro Abs: 2.1 10*3/uL (ref 1.7–7.7)
Neutrophils Relative %: 68 %
Platelets: 110 10*3/uL — ABNORMAL LOW (ref 150–400)
RBC: 3.51 MIL/uL — ABNORMAL LOW (ref 4.22–5.81)
RDW: 17.6 % — ABNORMAL HIGH (ref 11.5–15.5)
WBC: 3.1 10*3/uL — ABNORMAL LOW (ref 4.0–10.5)
nRBC: 0 % (ref 0.0–0.2)

## 2021-08-25 LAB — IRON AND TIBC
Iron: 129 ug/dL (ref 45–182)
Saturation Ratios: 40 % — ABNORMAL HIGH (ref 17.9–39.5)
TIBC: 321 ug/dL (ref 250–450)
UIBC: 192 ug/dL

## 2021-08-25 LAB — COMPREHENSIVE METABOLIC PANEL
ALT: 16 U/L (ref 0–44)
AST: 19 U/L (ref 15–41)
Albumin: 4.4 g/dL (ref 3.5–5.0)
Alkaline Phosphatase: 73 U/L (ref 38–126)
Anion gap: 7 (ref 5–15)
BUN: 15 mg/dL (ref 8–23)
CO2: 26 mmol/L (ref 22–32)
Calcium: 9.1 mg/dL (ref 8.9–10.3)
Chloride: 106 mmol/L (ref 98–111)
Creatinine, Ser: 0.95 mg/dL (ref 0.61–1.24)
GFR, Estimated: >60 mL/min (ref >60)
Glucose, Bld: 123 mg/dL — ABNORMAL HIGH (ref 70–99)
Potassium: 3.9 mmol/L (ref 3.5–5.1)
Sodium: 139 mmol/L (ref 135–145)
Total Bilirubin: 0.9 mg/dL (ref 0.3–1.2)
Total Protein: 7 g/dL (ref 6.5–8.1)

## 2021-08-25 LAB — HEPATITIS B SURFACE ANTIGEN: Hepatitis B Surface Ag: NONREACTIVE

## 2021-08-25 LAB — VITAMIN B12: Vitamin B-12: 1389 pg/mL — ABNORMAL HIGH (ref 180–914)

## 2021-08-25 LAB — TECHNOLOGIST SMEAR REVIEW
Plt Morphology: NORMAL
RBC MORPHOLOGY: NORMAL
WBC MORPHOLOGY: NORMAL

## 2021-08-25 LAB — LACTATE DEHYDROGENASE: LDH: 129 U/L (ref 98–192)

## 2021-08-25 LAB — HEPATITIS C ANTIBODY: HCV Ab: NONREACTIVE

## 2021-08-25 LAB — FERRITIN: Ferritin: 188 ng/mL (ref 24–336)

## 2021-08-25 LAB — IMMATURE PLATELET FRACTION: Immature Platelet Fraction: 8.9 % — ABNORMAL HIGH (ref 1.2–8.6)

## 2021-08-25 NOTE — Assessment & Plan Note (Addendum)
#   PANCYTOPENIA-[since  2013] hemoglobin-10 ;  platelets 90-100.  The etiology is unclear.   #I reviewed with the patient and family the multiple causes of bi- pancytopenia include-benign causes like liver disease/hypersplenism; vitamin deficiencies--including B 12 folic acid; viral infections-hepatitis; autoimmune causes; medications etc.   # I also reviewed malignant causes include: leukemias-MDS/lymphomas  and rare infiltration of the bone marrow with carcinomas.  However clinically less likely given patient's longstanding > 10 years-mild anemia/thrombocytopenia.  However MDS is still a possibility.   # Recommend blood work-CBC CMP LDH hepatitis panel;iron studies ferritin; reticulocyte count review of peripheral smear; B12.   Thank you Dr. Doy Hutching  for allowing me to participate in the care of your pleasant patient. Please do not hesitate to contact me with questions or concerns in the interim.  # DISPOSITION: # labs today- ordered # US abdomen # follow up in 2-3 weeks; MD; No labs- Dr.B

## 2021-08-25 NOTE — Progress Notes (Signed)
[name] NOTE  Patient Care Team: Idelle Crouch, MD as PCP - General (Internal Medicine) Cammie Sickle, MD as Consulting Physician (Oncology)  CHIEF COMPLAINTS/PURPOSE OF CONSULTATION: Pancytopenia   HEMATOLOGY HISTORY  # PANCYTOPENIA [phone] platelets-  100 Hb-9-10 MCV- HIV/Hepatitis: Alcohol;no; CT: US/none:   HISTORY OF PRESENTING ILLNESS: Alone. Ambulating.   [facility] 88 y.o.  male pleasant patient was been referred to [place] for further evaluation of pancytopenia.   No prior history of any hematologic disorder/or prior bone marrow biopsies.  Patient denies any extreme fatigue.  Denies any nausea vomiting weight loss.  Denies any chest pain or shortness of breath or cough.  No fevers no chills.  Alcohol:none Hepatitis:none PNT:IRWE Auto-immune disease:none Herbal medications/ new medications:none Hx of malignancy:none CT scan/ [place]: none    Review of Systems  Constitutional:  Negative for chills, diaphoresis, fever, malaise/fatigue and weight loss.  HENT:  Negative for nosebleeds and sore throat.   Eyes:  Negative for double vision.  Respiratory:  Negative for cough, hemoptysis, sputum production, shortness of breath and wheezing.   Cardiovascular:  Negative for chest pain, palpitations, orthopnea and leg swelling.  Gastrointestinal:  Negative for abdominal pain, blood in stool, constipation, diarrhea, heartburn, melena, nausea and vomiting.  Genitourinary:  Negative for dysuria, frequency and urgency.  Musculoskeletal:  Positive for joint pain. Negative for back pain.  Skin: Negative.  Negative for itching and rash.  Neurological:  Negative for dizziness, tingling, focal weakness, weakness and headaches.  Endo/Heme/Allergies:  Does not bruise/bleed easily.  Psychiatric/Behavioral:  Negative for depression. The patient is not nervous/anxious and does not have insomnia.      MEDICAL HISTORY:  Past Medical History:  Diagnosis Date    Basal cell carcinoma 11/05/2012   R prox med knee   SCC (squamous cell carcinoma) 04/09/2018   right preauricular inf sideburn area, 2.0cm ant to sideburn   Squamous cell carcinoma of skin 11/03/2008   R cheek - SCCIS   Squamous cell carcinoma of skin 03/10/2009   L hand   Squamous cell carcinoma of skin 05/07/2013   L dorsal hand   Squamous cell carcinoma of skin 01/08/2019   R forearm   Squamous cell carcinoma of skin 02/10/2021   right prox volar forearm, treated EDC    SURGICAL HISTORY: Past Surgical History:  Procedure Laterality Date   CHOLECYSTECTOMY      SOCIAL HISTORY: Social History   Socioeconomic History   Marital status: Widowed    Spouse name: Not on file   Number of children: Not on file   Years of education: Not on file   Highest education level: Not on file  Occupational History   Not on file  Tobacco Use   Smoking status: Former    Types: Cigarettes   Smokeless tobacco: Never  Vaping Use   Vaping Use: Never used  Substance and Sexual Activity   Alcohol use: Never   Drug use: Not on file   Sexual activity: Not on file  Other Topics Concern   Not on file  Social History Narrative   3-4 miles/day; quit smoking > 40 years; no alcohol. Worked for [employer]. Lives in [place]; self.    Social Determinants of Health   Financial Resource Strain: Not on file  Food Insecurity: Not on file  Transportation Needs: Not on file  Physical Activity: Not on file  Stress: Not on file  Social Connections: Not on file  Intimate Partner Violence: Not on file  FAMILY HISTORY: Family History  Problem Relation Age of Onset   Breast cancer Sister    Breast cancer Daughter    Colon cancer Daughter     ALLERGIES:  has No Known Allergies.  MEDICATIONS:  Current Outpatient Medications  Medication Sig Dispense Refill   Cholecalciferol 25 MCG (1000 UT) tablet Take by mouth.     Cinnamon 500 MG capsule Take by mouth.     cyanocobalamin 1000 MCG tablet  Take by mouth.     lansoprazole (PREVACID) 30 MG capsule Take 30 mg by mouth daily.     Multiple Vitamins-Minerals (PRESERVISION AREDS 2) CAPS Take by mouth.     No current facility-administered medications for this visit.     PHYSICAL EXAMINATION:   Vitals:   08/25/21 1057  BP: 124/68  Pulse: 69  Temp: 98.2 F (36.8 C)  SpO2: 100%   Filed Weights   08/25/21 1057  Weight: 196 lb 12.8 oz (89.3 kg)    Physical Exam Vitals and nursing note reviewed.  HENT:     Head: Normocephalic and atraumatic.     Mouth/Throat:     Pharynx: Oropharynx is clear.  Eyes:     Extraocular Movements: Extraocular movements intact.     Pupils: Pupils are equal, round, and reactive to light.  Cardiovascular:     Rate and Rhythm: Normal rate and regular rhythm.  Pulmonary:     Comments: Decreased breath sounds bilaterally.  Abdominal:     Palpations: Abdomen is soft.  Musculoskeletal:        General: Normal range of motion.     Cervical back: Normal range of motion.  Skin:    General: Skin is warm.  Neurological:     General: No focal deficit present.     Mental Status: He is alert and oriented to person, place, and time.  Psychiatric:        Behavior: Behavior normal.        Judgment: Judgment normal.      LABORATORY DATA:  I have reviewed the data as listed Lab Results  Component Value Date   WBC 3.1 (L) 08/25/2021   HGB 10.8 (L) 08/25/2021   HCT 32.4 (L) 08/25/2021   MCV 92.3 08/25/2021   PLT 110 (L) 08/25/2021   Recent Labs    08/25/21 1150  NA 139  K 3.9  CL 106  CO2 26  GLUCOSE 123*  BUN 15  CREATININE 0.95  CALCIUM 9.1  GFRNONAA >60  PROT 7.0  ALBUMIN 4.4  AST 19  ALT 16  ALKPHOS 73  BILITOT 0.9     No results found.  ASSESSMENT & PLAN:   Symptomatic anemia # PANCYTOPENIA-[since  2013] hemoglobin-10 ;  platelets 90-100.  The etiology is unclear.   #I reviewed with the patient and family the multiple causes of bi- pancytopenia include-benign causes  like liver disease/hypersplenism; vitamin deficiencies--including B 12 folic acid; viral infections-hepatitis; autoimmune causes; medications etc.   # I also reviewed malignant causes include: leukemias-MDS/lymphomas  and rare infiltration of the bone marrow with carcinomas.  However clinically less likely given patient's longstanding > 10 years-mild anemia/thrombocytopenia.  However MDS is still a possibility.   # Recommend blood work-CBC CMP LDH hepatitis panel;iron studies ferritin; reticulocyte count review of peripheral smear; B12.   Thank you Dr. Doy Hutching  for allowing me to participate in the care of your pleasant patient. Please do not hesitate to contact me with questions or concerns in the interim.  # DISPOSITION: #  labs today- ordered # US abdomen # follow up in 2-3 weeks; MD; No labs- Dr.B    All questions were answered. The patient knows to call the clinic with any problems, questions or concerns.    Cammie Sickle, MD 08/25/2021 3:21 PM

## 2021-08-27 LAB — KAPPA/LAMBDA LIGHT CHAINS
Kappa free light chain: 22.8 mg/L — ABNORMAL HIGH (ref 3.3–19.4)
Kappa, lambda light chain ratio: 1.63 (ref 0.26–1.65)
Lambda free light chains: 14 mg/L (ref 5.7–26.3)

## 2021-08-30 LAB — MULTIPLE MYELOMA PANEL, SERUM
Albumin SerPl Elph-Mcnc: 4 g/dL (ref 2.9–4.4)
Albumin/Glob SerPl: 1.7 (ref 0.7–1.7)
Alpha 1: 0.2 g/dL (ref 0.0–0.4)
Alpha2 Glob SerPl Elph-Mcnc: 0.6 g/dL (ref 0.4–1.0)
B-Globulin SerPl Elph-Mcnc: 0.9 g/dL (ref 0.7–1.3)
Gamma Glob SerPl Elph-Mcnc: 0.8 g/dL (ref 0.4–1.8)
Globulin, Total: 2.5 g/dL (ref 2.2–3.9)
IgA: 253 mg/dL (ref 61–437)
IgG (Immunoglobin G), Serum: 853 mg/dL (ref 603–1613)
IgM (Immunoglobulin M), Srm: 59 mg/dL (ref 15–143)
Total Protein ELP: 6.5 g/dL (ref 6.0–8.5)

## 2021-09-01 ENCOUNTER — Ambulatory Visit: Payer: Medicare Other | Attending: Internal Medicine

## 2021-09-17 ENCOUNTER — Ambulatory Visit: Payer: Medicare Other | Admitting: Internal Medicine

## 2021-09-17 ENCOUNTER — Encounter: Payer: Self-pay | Admitting: Internal Medicine

## 2021-09-17 ENCOUNTER — Inpatient Hospital Stay: Payer: Medicare Other | Attending: Internal Medicine | Admitting: Internal Medicine

## 2021-09-17 DIAGNOSIS — Z87891 Personal history of nicotine dependence: Secondary | ICD-10-CM | POA: Diagnosis not present

## 2021-09-17 DIAGNOSIS — D649 Anemia, unspecified: Secondary | ICD-10-CM | POA: Diagnosis not present

## 2021-09-17 DIAGNOSIS — D61818 Other pancytopenia: Secondary | ICD-10-CM | POA: Diagnosis present

## 2021-09-17 NOTE — Progress Notes (Signed)
Patient denies new problems/concerns today.   °

## 2021-09-17 NOTE — Progress Notes (Signed)
[facility]  Patient Care Team: Idelle Crouch, MD as PCP - General (Internal Medicine) Cammie Sickle, MD as Consulting Physician (Oncology)  CHIEF COMPLAINTS/PURPOSE OF CONSULTATION: Pancytopenia   HEMATOLOGY HISTORY  # PANCYTOPENIA [phone] platelets-  100 Hb-9-10 MCV- HIV/Hepatitis: Alcohol;no; CT: US/none:   HISTORY OF PRESENTING ILLNESS: Alone. Ambulating.   Vincent Church 88 y.o.  male pleasant patient is here for a follow up of his work up for pancytopenia.   Patient denies any extreme fatigue.  Denies any nausea vomiting weight loss.  Denies any chest pain or shortness of breath or cough.  No fevers no chills    Review of Systems  Constitutional:  Negative for chills, diaphoresis, fever, malaise/fatigue and weight loss.  HENT:  Negative for nosebleeds and sore throat.   Eyes:  Negative for double vision.  Respiratory:  Negative for cough, hemoptysis, sputum production, shortness of breath and wheezing.   Cardiovascular:  Negative for chest pain, palpitations, orthopnea and leg swelling.  Gastrointestinal:  Negative for abdominal pain, blood in stool, constipation, diarrhea, heartburn, melena, nausea and vomiting.  Genitourinary:  Negative for dysuria, frequency and urgency.  Musculoskeletal:  Positive for joint pain. Negative for back pain.  Skin: Negative.  Negative for itching and rash.  Neurological:  Negative for dizziness, tingling, focal weakness, weakness and headaches.  Endo/Heme/Allergies:  Does not bruise/bleed easily.  Psychiatric/Behavioral:  Negative for depression. The patient is not nervous/anxious and does not have insomnia.      MEDICAL HISTORY:  Past Medical History:  Diagnosis Date   Basal cell carcinoma 11/05/2012   R prox med knee   SCC (squamous cell carcinoma) 04/09/2018   right preauricular inf sideburn area, 2.0cm ant to sideburn   Squamous cell carcinoma of skin 11/03/2008   R cheek - SCCIS   Squamous  cell carcinoma of skin 03/10/2009   L hand   Squamous cell carcinoma of skin 05/07/2013   L dorsal hand   Squamous cell carcinoma of skin 01/08/2019   R forearm   Squamous cell carcinoma of skin 02/10/2021   right prox volar forearm, treated EDC    SURGICAL HISTORY: Past Surgical History:  Procedure Laterality Date   CHOLECYSTECTOMY      SOCIAL HISTORY: Social History   Socioeconomic History   Marital status: Widowed    Spouse name: Not on file   Number of children: Not on file   Years of education: Not on file   Highest education level: Not on file  Occupational History   Not on file  Tobacco Use   Smoking status: Former    Types: Cigarettes   Smokeless tobacco: Never  Vaping Use   Vaping Use: Never used  Substance and Sexual Activity   Alcohol use: Never   Drug use: Not on file   Sexual activity: Not on file  Other Topics Concern   Not on file  Social History Narrative   3-4 miles/day; quit smoking > 40 years; no alcohol. Worked for [employer]. Lives in [place]; self.    Social Determinants of Health   Financial Resource Strain: Not on file  Food Insecurity: Not on file  Transportation Needs: Not on file  Physical Activity: Not on file  Stress: Not on file  Social Connections: Not on file  Intimate Partner Violence: Not on file    FAMILY HISTORY: Family History  Problem Relation Age of Onset   Breast cancer Sister    Breast cancer Daughter  Colon cancer Daughter     ALLERGIES:  has No Known Allergies.  MEDICATIONS:  Current Outpatient Medications  Medication Sig Dispense Refill   Cholecalciferol 25 MCG (1000 UT) tablet Take by mouth.     Cinnamon 500 MG capsule Take by mouth.     cyanocobalamin 1000 MCG tablet Take by mouth.     lansoprazole (PREVACID) 30 MG capsule Take 30 mg by mouth daily.     Multiple Vitamins-Minerals (PRESERVISION AREDS 2) CAPS Take by mouth.     No current facility-administered medications for this visit.      PHYSICAL EXAMINATION:   Vitals:   09/17/21 0800  BP: (!) 119/53  Pulse: 68  Resp: 16  Temp: (!) 97.1 F (36.2 C)   Filed Weights   09/17/21 0800  Weight: 196 lb 9.6 oz (89.2 kg)    Physical Exam Vitals and nursing Church reviewed.  HENT:     Head: Normocephalic and atraumatic.     Mouth/Throat:     Pharynx: Oropharynx is clear.  Eyes:     Extraocular Movements: Extraocular movements intact.     Pupils: Pupils are equal, round, and reactive to light.  Cardiovascular:     Rate and Rhythm: Normal rate and regular rhythm.  Pulmonary:     Comments: Decreased breath sounds bilaterally.  Abdominal:     Palpations: Abdomen is soft.  Musculoskeletal:        General: Normal range of motion.     Cervical back: Normal range of motion.  Skin:    General: Skin is warm.  Neurological:     General: No focal deficit present.     Mental Status: He is alert and oriented to person, place, and time.  Psychiatric:        Behavior: Behavior normal.        Judgment: Judgment normal.      LABORATORY DATA:  I have reviewed the data as listed Lab Results  Component Value Date   WBC 3.1 (L) 08/25/2021   HGB 10.8 (L) 08/25/2021   HCT 32.4 (L) 08/25/2021   MCV 92.3 08/25/2021   PLT 110 (L) 08/25/2021   Recent Labs    08/25/21 1150  NA 139  K 3.9  CL 106  CO2 26  GLUCOSE 123*  BUN 15  CREATININE 0.95  CALCIUM 9.1  GFRNONAA >60  PROT 7.0  ALBUMIN 4.4  AST 19  ALT 16  ALKPHOS 73  BILITOT 0.9     No results found.  ASSESSMENT & PLAN:   Symptomatic anemia # PANCYTOPENIA-[since  2013] hemoglobin-10 ;  platelets 90-100. AUG 2023-work-up including iron deficiency B12; LFTs normal.  Ultrasound pending the etiology is unclear- STABLE.   # I also reviewed malignant causes include: leukemias-MDS-it is still a possibility.  However, given the overall stability over 10 years-I think low-grade MDS is a distinct possibility.  Discussed regarding bone marrow biopsy; however given  overall stability of his labs-specialist patient is not very symptomatic.  Recommend holding off bone marrow biopsy at this time.  Follow-up in 6 months.  Reminded the patient of ultrasound.  # DISPOSITION: # US abdomen- please schedule # follow up in 6 months MD; labs- cbc/cmp; LDH-- Dr.B    All questions were answered. The patient knows to call the clinic with any problems, questions or concerns.    Cammie Sickle, MD 09/17/2021 9:01 AM

## 2021-09-17 NOTE — Assessment & Plan Note (Addendum)
#  PANCYTOPENIA-[since  2013] hemoglobin-10 ;  platelets 90-100. AUG 2023-work-up including iron deficiency B12; LFTs normal.  Ultrasound pending the etiology is unclear- STABLE.   # I also reviewed malignant causes include: leukemias-MDS-it is still a possibility.  However, given the overall stability over 10 years-I think low-grade MDS is a distinct possibility.  Discussed regarding bone marrow biopsy; however given overall stability of his labs-specialist patient is not very symptomatic.  Recommend holding off bone marrow biopsy at this time.  Follow-up in 6 months.  Reminded the patient of ultrasound.  # DISPOSITION: # US abdomen- please schedule # follow up in 6 months MD; labs- cbc/cmp; [facility]-- Dr.B

## 2021-09-23 ENCOUNTER — Ambulatory Visit: Admission: RE | Admit: 2021-09-23 | Payer: Medicare Other | Source: Ambulatory Visit

## 2021-10-05 ENCOUNTER — Ambulatory Visit
Admission: RE | Admit: 2021-10-05 | Discharge: 2021-10-05 | Disposition: A | Discharge status: Home / Self Care | Payer: Medicare Other | Source: Ambulatory Visit | Attending: Internal Medicine | Admitting: Internal Medicine

## 2021-10-05 DIAGNOSIS — D696 Thrombocytopenia, unspecified: Secondary | ICD-10-CM | POA: Insufficient documentation

## 2021-10-05 DIAGNOSIS — D649 Anemia, unspecified: Secondary | ICD-10-CM | POA: Insufficient documentation

## 2021-10-08 ENCOUNTER — Telehealth: Payer: Self-pay | Admitting: *Deleted

## 2021-10-08 NOTE — Telephone Encounter (Signed)
Patient aware of results and verbalized understanding.

## 2021-10-08 NOTE — Telephone Encounter (Signed)
Call attempted to review results of US abdomen. Left vm for patient to return call.

## 2021-10-08 NOTE — Telephone Encounter (Signed)
-----   Message from Cammie Sickle, MD sent at 10/08/2021  1:16 PM EDT ----- Please inform pt that his ultrasound abdomen is normal-does not show any evidence of cirrhosis or enlarged spleen.  The liver and the kidneys show cysts-which appear benign.   Recommend follow-up as planned GB

## 2021-11-03 ENCOUNTER — Ambulatory Visit (INDEPENDENT_AMBULATORY_CARE_PROVIDER_SITE_OTHER): Payer: Medicare Other | Admitting: Dermatology

## 2021-11-03 DIAGNOSIS — L821 Other seborrheic keratosis: Secondary | ICD-10-CM

## 2021-11-03 DIAGNOSIS — Z79899 Other long term (current) drug therapy: Secondary | ICD-10-CM

## 2021-11-03 DIAGNOSIS — Z5111 Encounter for antineoplastic chemotherapy: Secondary | ICD-10-CM | POA: Diagnosis not present

## 2021-11-03 DIAGNOSIS — L570 Actinic keratosis: Secondary | ICD-10-CM

## 2021-11-03 DIAGNOSIS — L820 Inflamed seborrheic keratosis: Secondary | ICD-10-CM | POA: Diagnosis not present

## 2021-11-03 DIAGNOSIS — L578 Other skin changes due to chronic exposure to nonionizing radiation: Secondary | ICD-10-CM

## 2021-11-03 NOTE — Progress Notes (Signed)
Follow-Up Visit   Subjective  Vincent Church is a 88 y.o. male who presents for the following: Actinic Keratosis (4 month follow up of face, ears, arms treated with LN2, face treated with 5FU/Calcipotriene cream) and Other (Irritating spots of neck). The patient has spots, moles and lesions to be evaluated, some may be new or changing and the patient has concerns that these could be cancer.  The following portions of the chart were reviewed this encounter and updated as appropriate:   Tobacco  Allergies  Meds  Problems  Med Hx  Surg Hx  Fam Hx     Review of Systems:  No other skin or systemic complaints except as noted in HPI or Assessment and Plan.  Objective  Well appearing patient in no apparent distress; mood and affect are within normal limits.  A focused examination was performed including scalp, face, arms. Relevant physical exam findings are noted in the Assessment and Plan.  Scalp x 1, neck x 3, face x 1 (5) Erythematous stuck-on, waxy papule or plaque  Nose (2) Erythematous thin papules/macules with gritty scale.    Assessment & Plan   Seborrheic Keratoses - Stuck-on, waxy, tan-brown papules and/or plaques  - Benign-appearing - Discussed benign etiology and prognosis. - Observe - Call for any changes  Inflamed seborrheic keratosis (5) Scalp x 1, neck x 3, face x 1  Destruction of lesion - Scalp x 1, neck x 3, face x 1 Complexity: simple   Destruction method: cryotherapy   Informed consent: discussed and consent obtained   Timeout:  patient name, date of birth, surgical site, and procedure verified Lesion destroyed using liquid nitrogen: Yes   Region frozen until ice ball extended beyond lesion: Yes   Outcome: patient tolerated procedure well with no complications   Post-procedure details: wound care instructions given    AK (actinic keratosis) (2) Nose  Actinic Damage with PreCancerous Actinic Keratoses Counseling for Topical Chemotherapy  Management: Patient exhibits: - Severe, confluent actinic changes with pre-cancerous actinic keratoses that is secondary to cumulative UV radiation exposure over time - Condition that is severe; chronic, not at goal. - diffuse scaly erythematous macules and papules with underlying dyspigmentation - Discussed Prescription "Field Treatment" topical Chemotherapy for Severe, Chronic Confluent Actinic Changes with Pre-Cancerous Actinic Keratoses Field treatment involves treatment of an entire area of skin that has confluent Actinic Changes (Sun/ Ultraviolet light damage) and PreCancerous Actinic Keratoses by method of PhotoDynamic Therapy (PDT) and/or prescription Topical Chemotherapy agents such as 5-fluorouracil, 5-fluorouracil/calcipotriene, and/or imiquimod.  The purpose is to decrease the number of clinically evident and subclinical PreCancerous lesions to prevent progression to development of skin cancer by chemically destroying early precancer changes that may or may not be visible.  It has been shown to reduce the risk of developing skin cancer in the treated area. As a result of treatment, redness, scaling, crusting, and open sores may occur during treatment course. One or more than one of these methods may be used and may have to be used several times to control, suppress and eliminate the PreCancerous changes. Discussed treatment course, expected reaction, and possible side effects. - Recommend daily broad spectrum sunscreen SPF 30+ to sun-exposed areas, reapply every 2 hours as needed.  - Staying in the shade or wearing long sleeves, sun glasses (UVA+UVB protection) and wide brim hats (4-inch brim around the entire circumference of the hat) are also recommended. - Call for new or changing lesions.  - Start 5-fluorouracil/calcipotriene cream twice a day  for 7 days to affected areas including arms. Prescription sent to [facility]. Patient advised they will receive an email  to purchase the medication online and have it sent to their home. Patient provided with handout reviewing treatment course and side effects and advised to call or message [place] on MyChart with any concerns.   Destruction of lesion - Nose Complexity: simple   Destruction method: cryotherapy   Informed consent: discussed and consent obtained   Timeout:  patient name, date of birth, surgical site, and procedure verified Lesion destroyed using liquid nitrogen: Yes   Region frozen until ice ball extended beyond lesion: Yes   Outcome: patient tolerated procedure well with no complications   Post-procedure details: wound care instructions given     Return in about 8 months (around 07/04/2022).  I, Ashok Cordia, CMA, am acting as scribe for Sarina Ser, MD . Documentation: I have reviewed the above documentation for accuracy and completeness, and I agree with the above.  Sarina Ser, MD

## 2021-11-03 NOTE — Patient Instructions (Signed)
Instructions for [facility]  One or more of your medications was sent to the [facility] mail order [facility]. You will receive an email from them and can purchase the medicine through that link. It will then be mailed to your home at the address you confirmed. If for any reason you do not receive an email from them, please check your spam folder. If you still do not find the email, please let us know. [facility] phone number is [phone].    Cryotherapy Aftercare  Wash gently with soap and water everyday.   Apply Vaseline and Band-Aid daily until healed.   Due to recent changes in healthcare laws, you may see results of your pathology and/or laboratory studies on MyChart before the doctors have had a chance to review them. We understand that in some cases there may be results that are confusing or concerning to you. Please understand that not all results are received at the same time and often the doctors may need to interpret multiple results in order to provide you with the best plan of care or course of treatment. Therefore, we ask that you please give us 2 business days to thoroughly review all your results before contacting the office for clarification. Should we see a critical lab result, you will be contacted sooner.   If You Need Anything After Your Visit  If you have any questions or concerns for your doctor, please call our main line at [phone] and press option 4 to reach your doctor's medical assistant. If no one answers, please leave a voicemail as directed and we will return your call as soon as possible. Messages left after 4 pm will be answered the following business day.   You may also send us a message via MyChart. We typically respond to MyChart messages within 1-2 business days.  For prescription refills, please ask your pharmacy to contact our office. Our fax number is [phone].  If you have an urgent issue when the clinic is  closed that cannot wait until the next business day, you can page your doctor at the number below.    Please note that while we do our best to be available for urgent issues outside of office hours, we are not available 24/7.   If you have an urgent issue and are unable to reach us, you may choose to seek medical care at your doctor's office, retail clinic, urgent care center, or emergency room.  If you have a medical emergency, please immediately call 911 or go to the emergency department.  Pager Numbers  - Dr. Kowalski: [phone]  - Dr. Moye: [phone]  - Dr. Stewart: [phone]  In the event of inclement weather, please call our main line at [phone] for an update on the status of any delays or closures.  Dermatology Medication Tips: Please keep the boxes that topical medications come in in order to help keep track of the instructions about where and how to use these. Pharmacies typically print the medication instructions only on the boxes and not directly on the medication tubes.   If your medication is too expensive, please contact our office at [phone] option 4 or send us a message through MyChart.   We are unable to tell what your co-pay for medications will be in advance as this is different depending on your insurance coverage. However, we may be able to find a substitute medication at lower cost or fill out paperwork to get insurance to cover a   needed medication.   If a prior authorization is required to get your medication covered by your insurance company, please allow us 1-2 business days to complete this process.  Drug prices often vary depending on where the prescription is filled and some pharmacies may offer cheaper prices.  The website www.goodrx.com contains coupons for medications through different pharmacies. The prices here do not account for what the cost may be with help from insurance (it may be cheaper with your insurance), but the website can  give you the price if you did not use any insurance.  - You can print the associated coupon and take it with your prescription to the pharmacy.  - You may also stop by our office during regular business hours and pick up a GoodRx coupon card.  - If you need your prescription sent electronically to a different pharmacy, notify our office through [place] MyChart or by phone at [phone] option 4.     Si Usted Necesita Algo Despus de Su Visita  Tambin puede enviarnos un mensaje a travs de MyChart. Por lo general respondemos a los mensajes de MyChart en el transcurso de 1 a 2 das hbiles.  Para renovar recetas, por favor pida a su farmacia que se ponga en contacto con nuestra oficina. Nuestro nmero de fax es el [phone].  Si tiene un asunto urgente cuando la clnica est cerrada y que no puede esperar hasta el siguiente da hbil, puede llamar/localizar a su doctor(a) al nmero que aparece a continuacin.   Por favor, tenga en cuenta que aunque hacemos todo lo posible para estar disponibles para asuntos urgentes fuera del horario de oficina, no estamos disponibles las 24 horas del da, los 7 das de la semana.   Si tiene un problema urgente y no puede comunicarse con nosotros, puede optar por buscar atencin mdica  en el consultorio de su doctor(a), en una clnica privada, en un centro de atencin urgente o en una sala de emergencias.  Si tiene una emergencia mdica, por favor llame inmediatamente al 911 o vaya a la sala de emergencias.  Nmeros de bper  - Dr. Kowalski: [phone]  - Dra. Moye: [phone]  - Dra. Stewart: [phone]  En caso de inclemencias del tiempo, por favor llame a nuestra lnea principal al [phone] para una actualizacin sobre el estado de cualquier retraso o cierre.  Consejos para la medicacin en dermatologa: Por favor, guarde las cajas en las que vienen los medicamentos de uso tpico para ayudarle a seguir las instrucciones sobre  dnde y cmo usarlos. Las farmacias generalmente imprimen las instrucciones del medicamento slo en las cajas y no directamente en los tubos del medicamento.   Si su medicamento es muy caro, por favor, pngase en contacto con nuestra oficina llamando al [phone] y presione la opcin 4 o envenos un mensaje a travs de MyChart.   No podemos decirle cul ser su copago por los medicamentos por adelantado ya que esto es diferente dependiendo de la cobertura de su seguro. Sin embargo, es posible que podamos encontrar un medicamento sustituto a menor costo o llenar un formulario para que el seguro cubra el medicamento que se considera necesario.   Si se requiere una autorizacin previa para que su compaa de seguros cubra su medicamento, por favor permtanos de 1 a 2 das hbiles para completar este proceso.  Los precios de los medicamentos varan con frecuencia dependiendo del lugar de dnde se surte la receta y alguna farmacias pueden ofrecer precios ms baratos.    El sitio web www.goodrx.com tiene cupones para medicamentos de diferentes farmacias. Los precios aqu no tienen en cuenta lo que podra costar con la ayuda del seguro (puede ser ms barato con su seguro), pero el sitio web puede darle el precio si no utiliz ningn seguro.  - Puede imprimir el cupn correspondiente y llevarlo con su receta a la farmacia.  - Tambin puede pasar por nuestra oficina durante el horario de atencin regular y recoger una tarjeta de cupones de GoodRx.  - Si necesita que su receta se enve electrnicamente a una farmacia diferente, informe a nuestra oficina a travs de MyChart de [place] o por telfono llamando al [phone] y presione la opcin 4.  

## 2021-11-18 ENCOUNTER — Encounter: Payer: Self-pay | Admitting: Dermatology

## 2022-03-18 ENCOUNTER — Inpatient Hospital Stay: Payer: Medicare Other | Admitting: Internal Medicine

## 2022-03-18 ENCOUNTER — Inpatient Hospital Stay: Payer: Medicare Other

## 2022-04-06 ENCOUNTER — Inpatient Hospital Stay: Payer: Medicare Other | Attending: Internal Medicine

## 2022-04-06 ENCOUNTER — Inpatient Hospital Stay (HOSPITAL_BASED_OUTPATIENT_CLINIC_OR_DEPARTMENT_OTHER): Payer: Medicare Other | Admitting: Internal Medicine

## 2022-04-06 VITALS — BP 126/56 | HR 72 | Temp 97.6°F | Resp 17 | Wt 189.5 lb

## 2022-04-06 DIAGNOSIS — Z87891 Personal history of nicotine dependence: Secondary | ICD-10-CM | POA: Insufficient documentation

## 2022-04-06 DIAGNOSIS — D649 Anemia, unspecified: Secondary | ICD-10-CM

## 2022-04-06 DIAGNOSIS — D61818 Other pancytopenia: Secondary | ICD-10-CM | POA: Insufficient documentation

## 2022-04-06 DIAGNOSIS — Z79899 Other long term (current) drug therapy: Secondary | ICD-10-CM | POA: Diagnosis not present

## 2022-04-06 LAB — CBC WITH DIFFERENTIAL/PLATELET
Abs Immature Granulocytes: 0.03 10*3/uL (ref 0.00–0.07)
Basophils Absolute: 0 10*3/uL (ref 0.0–0.1)
Basophils Relative: 1 %
Eosinophils Absolute: 0 10*3/uL (ref 0.0–0.5)
Eosinophils Relative: 0 %
HCT: 29.7 % — ABNORMAL LOW (ref 39.0–52.0)
Hemoglobin: 9.7 g/dL — ABNORMAL LOW (ref 13.0–17.0)
Immature Granulocytes: 1 %
Lymphocytes Relative: 24 %
Lymphs Abs: 0.9 10*3/uL (ref 0.7–4.0)
MCH: 30.1 pg (ref 26.0–34.0)
MCHC: 32.7 g/dL (ref 30.0–36.0)
MCV: 92.2 fL (ref 80.0–100.0)
Monocytes Absolute: 0.4 10*3/uL (ref 0.1–1.0)
Monocytes Relative: 11 %
Neutro Abs: 2.3 10*3/uL (ref 1.7–7.7)
Neutrophils Relative %: 63 %
Platelets: 110 10*3/uL — ABNORMAL LOW (ref 150–400)
RBC: 3.22 MIL/uL — ABNORMAL LOW (ref 4.22–5.81)
RDW: 18.8 % — ABNORMAL HIGH (ref 11.5–15.5)
WBC: 3.7 10*3/uL — ABNORMAL LOW (ref 4.0–10.5)
nRBC: 0 % (ref 0.0–0.2)

## 2022-04-06 LAB — COMPREHENSIVE METABOLIC PANEL
ALT: 14 U/L (ref 0–44)
AST: 18 U/L (ref 15–41)
Albumin: 3.9 g/dL (ref 3.5–5.0)
Alkaline Phosphatase: 75 U/L (ref 38–126)
Anion gap: 5 (ref 5–15)
BUN: 25 mg/dL — ABNORMAL HIGH (ref 8–23)
CO2: 26 mmol/L (ref 22–32)
Calcium: 9 mg/dL (ref 8.9–10.3)
Chloride: 107 mmol/L (ref 98–111)
Creatinine, Ser: 1.03 mg/dL (ref 0.61–1.24)
GFR, Estimated: >60 mL/min (ref >60)
Glucose, Bld: 126 mg/dL — ABNORMAL HIGH (ref 70–99)
Potassium: 4.2 mmol/L (ref 3.5–5.1)
Sodium: 138 mmol/L (ref 135–145)
Total Bilirubin: 0.6 mg/dL (ref 0.3–1.2)
Total Protein: 6.3 g/dL — ABNORMAL LOW (ref 6.5–8.1)

## 2022-04-06 LAB — LACTATE DEHYDROGENASE: LDH: 112 U/L (ref 98–192)

## 2022-04-06 NOTE — Progress Notes (Signed)
No appetite. Just doesn't eat much anymore. His friend passed away 1 year ago whom he used to go out and eat with. Low energy. He walks a couple miles every morning. Plays golf once a week. Occ back pain. No blood in stools. Balance seems to be off at times. No falls.

## 2022-04-06 NOTE — Assessment & Plan Note (Addendum)
#   PANCYTOPENIA-[since  2013- Hemoglobin-10 ;  platelets 90-100]-clinically suggestive of MDS however bone marrow biopsy not done yet.  AUG 2023-work-up including iron deficiency B12; hepatitis/myeloma workup LFTs normal; OCT 2023- Ultrasound pending the etiology is unclear- over all stable.  # Patient continues to have mild anemia hemoglobin 9-10; mild leukopenia; normal differential-platelets greater than 100.  I again reviewed malignant causes include: leukemias-low-grade MDS-it is still a possibility.  Discussed regarding a bone marrow biopsy.However given stability of the disease for almost 10 years-, and as patient is asymptomatic-I think it is reasonable to continue surveillance at this time.  Discussed with the patient that bone marrow would be recommended if patient's counts worsen/patient symptomatic.  Discussed treatment options of low-grade MDS includes-injections/pills- like revlimid.   # weight loss: unlikely sec to hematologic issues.  Likely psychosocial.  Pt declines nutrition evaluation.   Added epo # DISPOSITION: # follow up in 6 months MD; labs- cbc/cmp;LDH-- Dr.B

## 2022-04-06 NOTE — Addendum Note (Signed)
Addended by: Sofie Rower A on: 04/06/2022 10:11 AM   Modules accepted: Orders

## 2022-04-06 NOTE — Progress Notes (Signed)
[facility] NOTE  Patient Care Team: Idelle Crouch, MD as PCP - General (Internal Medicine) Cammie Sickle, MD as Consulting Physician (Oncology)  CHIEF COMPLAINTS/PURPOSE OF CONSULTATION: Pancytopenia   HEMATOLOGY HISTORY  # PANCYTOPENIA [id number] platelets-  100 Hb-9-10 MCV- HIV/Hepatitis: Alcohol; OCT 2023-ultrasound abdomen is normal-does not show any evidence of cirrhosis or enlarged spleen.  The liver and the kidneys show cysts-which appear benign.   HISTORY OF PRESENTING ILLNESS: Alone. Ambulating.   Vincent Church 89 y.o.  male pleasant patient is here for a follow up of his pancytopenia.   No appetite. Just doesn't eat much anymore. His friend passed away 1 year ago whom he used to go out and eat with. Low energy. He walks a couple miles every morning.   Plays golf once a week. Occ back pain. No blood in stools. Balance seems to be off at times. No falls   Patient denies any extreme fatigue.  Denies any nausea vomiting.  Denies any chest pain or shortness of breath or cough.  No fevers no chills.    Review of Systems  Constitutional:  Negative for chills, diaphoresis, fever, malaise/fatigue and weight loss.  HENT:  Negative for nosebleeds and sore throat.   Eyes:  Negative for double vision.  Respiratory:  Negative for cough, hemoptysis, sputum production, shortness of breath and wheezing.   Cardiovascular:  Negative for chest pain, palpitations, orthopnea and leg swelling.  Gastrointestinal:  Negative for abdominal pain, blood in stool, constipation, diarrhea, heartburn, melena, nausea and vomiting.  Genitourinary:  Negative for dysuria, frequency and urgency.  Musculoskeletal:  Positive for joint pain. Negative for back pain.  Skin: Negative.  Negative for itching and rash.  Neurological:  Negative for dizziness, tingling, focal weakness, weakness and headaches.  Endo/Heme/Allergies:  Does not bruise/bleed easily.  Psychiatric/Behavioral:   Negative for depression. The patient is not nervous/anxious and does not have insomnia.      MEDICAL HISTORY:  Past Medical History:  Diagnosis Date   Basal cell carcinoma 11/05/2012   R prox med knee   SCC (squamous cell carcinoma) 04/09/2018   right preauricular inf sideburn area, 2.0cm ant to sideburn   Squamous cell carcinoma of skin 11/03/2008   R cheek - SCCIS   Squamous cell carcinoma of skin 03/10/2009   L hand   Squamous cell carcinoma of skin 05/07/2013   L dorsal hand   Squamous cell carcinoma of skin 01/08/2019   R forearm   Squamous cell carcinoma of skin 02/10/2021   right prox volar forearm, treated EDC    SURGICAL HISTORY: Past Surgical History:  Procedure Laterality Date   CHOLECYSTECTOMY      SOCIAL HISTORY: Social History   Socioeconomic History   Marital status: Widowed    Spouse name: Not on file   Number of children: Not on file   Years of education: Not on file   Highest education level: Not on file  Occupational History   Not on file  Tobacco Use   Smoking status: Former    Types: Cigarettes   Smokeless tobacco: Never  Vaping Use   Vaping Use: Never used  Substance and Sexual Activity   Alcohol use: Never   Drug use: Not on file   Sexual activity: Not on file  Other Topics Concern   Not on file  Social History Narrative   3-4 miles/day; quit smoking > 40 years; no alcohol. Worked for [employer]. Lives in [place]; self.    Social  Determinants of Health   Financial Resource Strain: Not on file  Food Insecurity: Not on file  Transportation Needs: Not on file  Physical Activity: Not on file  Stress: Not on file  Social Connections: Not on file  Intimate Partner Violence: Not on file    FAMILY HISTORY: Family History  Problem Relation Age of Onset   Breast cancer Sister    Breast cancer Daughter    Colon cancer Daughter     ALLERGIES:  has No Known Allergies.  MEDICATIONS:  Current Outpatient Medications  Medication Sig  Dispense Refill   Cholecalciferol 25 MCG (1000 UT) tablet Take by mouth.     Cinnamon 500 MG capsule Take by mouth.     cyanocobalamin 1000 MCG tablet Take by mouth.     lansoprazole (PREVACID) 30 MG capsule Take 30 mg by mouth daily.     Multiple Vitamins-Minerals (PRESERVISION AREDS 2) CAPS Take by mouth.     No current facility-administered medications for this visit.     PHYSICAL EXAMINATION:   Vitals:   04/06/22 0927  BP: (!) 126/56  Pulse: 72  Resp: 17  Temp: 97.6 F (36.4 C)  SpO2: 99%   Filed Weights   04/06/22 0927  Weight: 189 lb 8 oz (86 kg)    Physical Exam Vitals and nursing note reviewed.  HENT:     Head: Normocephalic and atraumatic.     Mouth/Throat:     Pharynx: Oropharynx is clear.  Eyes:     Extraocular Movements: Extraocular movements intact.     Pupils: Pupils are equal, round, and reactive to light.  Cardiovascular:     Rate and Rhythm: Normal rate and regular rhythm.  Pulmonary:     Comments: Decreased breath sounds bilaterally.  Abdominal:     Palpations: Abdomen is soft.  Musculoskeletal:        General: Normal range of motion.     Cervical back: Normal range of motion.  Skin:    General: Skin is warm.  Neurological:     General: No focal deficit present.     Mental Status: He is alert and oriented to person, place, and time.  Psychiatric:        Behavior: Behavior normal.        Judgment: Judgment normal.      LABORATORY DATA:  I have reviewed the data as listed Lab Results  Component Value Date   WBC 3.7 (L) 04/06/2022   HGB 9.7 (L) 04/06/2022   HCT 29.7 (L) 04/06/2022   MCV 92.2 04/06/2022   PLT 110 (L) 04/06/2022   Recent Labs    08/25/21 1150 04/06/22 0912  NA 139 138  K 3.9 4.2  CL 106 107  CO2 26 26  GLUCOSE 123* 126*  BUN 15 25*  CREATININE 0.95 1.03  CALCIUM 9.1 9.0  GFRNONAA >60 >60  PROT 7.0 6.3*  ALBUMIN 4.4 3.9  AST 19 PENDING  ALT 16 14  ALKPHOS 73 75  BILITOT 0.9 0.6     No results  found.  ASSESSMENT & PLAN:   Symptomatic anemia # PANCYTOPENIA-[since  2013- Hemoglobin-10 ;  platelets 90-100]-clinically suggestive of MDS however bone marrow biopsy not done yet.  AUG 2023-work-up including iron deficiency B12; hepatitis/myeloma workup LFTs normal; OCT 2023- Ultrasound pending the etiology is unclear- over all stable.  # Patient continues to have mild anemia hemoglobin 9-10; mild leukopenia; normal differential-platelets greater than 100.  I again reviewed malignant causes include: leukemias-low-grade MDS-it is still a  possibility.  Discussed regarding a bone marrow biopsy.However given stability of the disease for almost 10 years-, and as patient is asymptomatic-I think it is reasonable to continue surveillance at this time.  Discussed with the patient that bone marrow would be recommended if patient's counts worsen/patient symptomatic.  Discussed treatment options of low-grade MDS includes-injections/pills- like revlimid.   # weight loss: unlikely sec to hematologic issues.  Likely psychosocial.  Pt declines nutrition evaluation.   Added epo # DISPOSITION: # follow up in 6 months MD; labs- cbc/cmp;LDH-- Dr.B   All questions were answered. The patient knows to call the clinic with any problems, questions or concerns.    Cammie Sickle, MD 04/06/2022 10:05 AM

## 2022-07-13 ENCOUNTER — Encounter: Payer: Self-pay | Admitting: Dermatology

## 2022-07-13 ENCOUNTER — Ambulatory Visit (INDEPENDENT_AMBULATORY_CARE_PROVIDER_SITE_OTHER): Payer: Medicare Other | Admitting: Dermatology

## 2022-07-13 VITALS — BP 122/60 | HR 79

## 2022-07-13 DIAGNOSIS — L570 Actinic keratosis: Secondary | ICD-10-CM

## 2022-07-13 DIAGNOSIS — L821 Other seborrheic keratosis: Secondary | ICD-10-CM

## 2022-07-13 DIAGNOSIS — L814 Other melanin hyperpigmentation: Secondary | ICD-10-CM | POA: Diagnosis not present

## 2022-07-13 DIAGNOSIS — W908XXA Exposure to other nonionizing radiation, initial encounter: Secondary | ICD-10-CM | POA: Diagnosis not present

## 2022-07-13 DIAGNOSIS — L820 Inflamed seborrheic keratosis: Secondary | ICD-10-CM

## 2022-07-13 DIAGNOSIS — L578 Other skin changes due to chronic exposure to nonionizing radiation: Secondary | ICD-10-CM

## 2022-07-13 NOTE — Patient Instructions (Signed)
Seborrheic Keratosis  What causes seborrheic keratoses? Seborrheic keratoses are harmless, common skin growths that first appear during adult life.  As time goes by, more growths appear.  Some people may develop a large number of them.  Seborrheic keratoses appear on both covered and uncovered body parts.  They are not caused by sunlight.  The tendency to develop seborrheic keratoses can be inherited.  They vary in color from skin-colored to gray, brown, or even black.  They can be either smooth or have a rough, warty surface.   Seborrheic keratoses are superficial and look as if they were stuck on the skin.  Under the microscope this type of keratosis looks like layers upon layers of skin.  That is why at times the top layer may seem to fall off, but the rest of the growth remains and re-grows.    Treatment Seborrheic keratoses do not need to be treated, but can easily be removed in the office.  Seborrheic keratoses often cause symptoms when they rub on clothing or jewelry.  Lesions can be in the way of shaving.  If they become inflamed, they can cause itching, soreness, or burning.  Removal of a seborrheic keratosis can be accomplished by freezing, burning, or surgery. If any spot bleeds, scabs, or grows rapidly, please return to have it checked, as these can be an indication of a skin cancer.   Actinic keratoses are precancerous spots that appear secondary to cumulative UV radiation exposure/sun exposure over time. They are chronic with expected duration over 1 year. A portion of actinic keratoses will progress to squamous cell carcinoma of the skin. It is not possible to reliably predict which spots will progress to skin cancer and so treatment is recommended to prevent development of skin cancer.  Recommend daily broad spectrum sunscreen SPF 30+ to sun-exposed areas, reapply every 2 hours as needed.  Recommend staying in the shade or wearing long sleeves, sun glasses (UVA+UVB protection) and wide  brim hats (4-inch brim around the entire circumference of the hat). Call for new or changing lesions.

## 2022-07-13 NOTE — Progress Notes (Signed)
Follow-Up Visit   Subjective  Vincent Church is a 89 y.o. male who presents for the following:  8 month ak and isk followup The patient has spots, moles and lesions to be evaluated, some may be new or changing and the patient may have concern these could be cancer.  The following portions of the chart were reviewed this encounter and updated as appropriate: medications, allergies, medical history  Review of Systems:  No other skin or systemic complaints except as noted in HPI or Assessment and Plan.  Objective  Well appearing patient in no apparent distress; mood and affect are within normal limits.  A focused examination was performed of the following areas: Face, b/l arms  Relevant exam findings are noted in the Assessment and Plan.  ears / arms/ hands x 16 (16) Erythematous thin papules/macules with gritty scale.   face x 17 , right posterior neck x 1 (18) Erythematous stuck-on, waxy papule or plaque   Assessment & Plan   SEBORRHEIC KERATOSIS - Stuck-on, waxy, tan-brown papules and/or plaques  - Benign-appearing - Discussed benign etiology and prognosis. - Observe - Call for any changes  LENTIGINES Exam: scattered tan macules Due to sun exposure Treatment Plan: Benign-appearing, observe. Recommend daily broad spectrum sunscreen SPF 30+ to sun-exposed areas, reapply every 2 hours as needed.  Call for any changes  ACTINIC DAMAGE - chronic, secondary to cumulative UV radiation exposure/sun exposure over time - diffuse scaly erythematous macules with underlying dyspigmentation - Recommend daily broad spectrum sunscreen SPF 30+ to sun-exposed areas, reapply every 2 hours as needed.  - Recommend staying in the shade or wearing long sleeves, sun glasses (UVA+UVB protection) and wide brim hats (4-inch brim around the entire circumference of the hat). - Call for new or changing lesions.  Actinic keratosis (16) ears / arms/ hands x 16  Actinic keratoses are precancerous  spots that appear secondary to cumulative UV radiation exposure/sun exposure over time. They are chronic with expected duration over 1 year. A portion of actinic keratoses will progress to squamous cell carcinoma of the skin. It is not possible to reliably predict which spots will progress to skin cancer and so treatment is recommended to prevent development of skin cancer.  Recommend daily broad spectrum sunscreen SPF 30+ to sun-exposed areas, reapply every 2 hours as needed.  Recommend staying in the shade or wearing long sleeves, sun glasses (UVA+UVB protection) and wide brim hats (4-inch brim around the entire circumference of the hat). Call for new or changing lesions.  Destruction of lesion - ears / arms/ hands x 16 (16) Complexity: simple   Destruction method: cryotherapy   Informed consent: discussed and consent obtained   Timeout:  patient name, date of birth, surgical site, and procedure verified Lesion destroyed using liquid nitrogen: Yes   Region frozen until ice ball extended beyond lesion: Yes   Outcome: patient tolerated procedure well with no complications   Post-procedure details: wound care instructions given    Inflamed seborrheic keratosis (18) face x 17 , right posterior neck x 1  Symptomatic, irritating, patient would like treated.  Destruction of lesion - face x 17 , right posterior neck x 1 (18) Complexity: simple   Destruction method: cryotherapy   Informed consent: discussed and consent obtained   Timeout:  patient name, date of birth, surgical site, and procedure verified Lesion destroyed using liquid nitrogen: Yes   Region frozen until ice ball extended beyond lesion: Yes   Outcome: patient tolerated procedure well with  no complications   Post-procedure details: wound care instructions given     Return in about 8 months (around 03/13/2023) for ak and isk follow up.  IAsher Muir, CMA, am acting as scribe for Armida Sans, MD.  Documentation: I  have reviewed the above documentation for accuracy and completeness, and I agree with the above.  Armida Sans, MD

## 2022-07-15 ENCOUNTER — Encounter: Payer: Self-pay | Admitting: Dermatology

## 2022-10-05 ENCOUNTER — Encounter: Payer: Self-pay | Admitting: Internal Medicine

## 2022-10-05 ENCOUNTER — Inpatient Hospital Stay (HOSPITAL_BASED_OUTPATIENT_CLINIC_OR_DEPARTMENT_OTHER): Payer: Medicare Other | Admitting: Internal Medicine

## 2022-10-05 ENCOUNTER — Inpatient Hospital Stay: Payer: Medicare Other | Attending: Internal Medicine

## 2022-10-05 VITALS — BP 115/61 | HR 96 | Temp 97.7°F | Ht 72.0 in | Wt 174.0 lb

## 2022-10-05 DIAGNOSIS — D61818 Other pancytopenia: Secondary | ICD-10-CM | POA: Insufficient documentation

## 2022-10-05 DIAGNOSIS — D649 Anemia, unspecified: Secondary | ICD-10-CM

## 2022-10-05 DIAGNOSIS — Z87891 Personal history of nicotine dependence: Secondary | ICD-10-CM | POA: Insufficient documentation

## 2022-10-05 DIAGNOSIS — Z79899 Other long term (current) drug therapy: Secondary | ICD-10-CM | POA: Diagnosis not present

## 2022-10-05 LAB — LACTATE DEHYDROGENASE: LDH: 311 U/L — ABNORMAL HIGH (ref 98–192)

## 2022-10-05 LAB — CBC WITH DIFFERENTIAL (CANCER CENTER ONLY)
Abs Immature Granulocytes: 0.01 10*3/uL (ref 0.00–0.07)
Basophils Absolute: 0 10*3/uL (ref 0.0–0.1)
Basophils Relative: 1 %
Eosinophils Absolute: 0 10*3/uL (ref 0.0–0.5)
Eosinophils Relative: 0 %
HCT: 29.1 % — ABNORMAL LOW (ref 39.0–52.0)
Hemoglobin: 9 g/dL — ABNORMAL LOW (ref 13.0–17.0)
Immature Granulocytes: 0 %
Lymphocytes Relative: 17 %
Lymphs Abs: 0.5 10*3/uL — ABNORMAL LOW (ref 0.7–4.0)
MCH: 27.8 pg (ref 26.0–34.0)
MCHC: 30.9 g/dL (ref 30.0–36.0)
MCV: 89.8 fL (ref 80.0–100.0)
Monocytes Absolute: 0.4 10*3/uL (ref 0.1–1.0)
Monocytes Relative: 12 %
Neutro Abs: 2.2 10*3/uL (ref 1.7–7.7)
Neutrophils Relative %: 70 %
Platelet Count: 145 10*3/uL — ABNORMAL LOW (ref 150–400)
RBC: 3.24 MIL/uL — ABNORMAL LOW (ref 4.22–5.81)
RDW: 19.6 % — ABNORMAL HIGH (ref 11.5–15.5)
WBC Count: 3.1 10*3/uL — ABNORMAL LOW (ref 4.0–10.5)
nRBC: 0 % (ref 0.0–0.2)

## 2022-10-05 LAB — CMP (CANCER CENTER ONLY)
ALT: 15 U/L (ref 0–44)
AST: 21 U/L (ref 15–41)
Albumin: 3.5 g/dL (ref 3.5–5.0)
Alkaline Phosphatase: 100 U/L (ref 38–126)
Anion gap: 8 (ref 5–15)
BUN: 17 mg/dL (ref 8–23)
CO2: 27 mmol/L (ref 22–32)
Calcium: 9.7 mg/dL (ref 8.9–10.3)
Chloride: 104 mmol/L (ref 98–111)
Creatinine: 1.06 mg/dL (ref 0.61–1.24)
GFR, Estimated: >60 mL/min (ref >60)
Glucose, Bld: 140 mg/dL — ABNORMAL HIGH (ref 70–99)
Potassium: 4.7 mmol/L (ref 3.5–5.1)
Sodium: 139 mmol/L (ref 135–145)
Total Bilirubin: 0.8 mg/dL (ref 0.3–1.2)
Total Protein: 7 g/dL (ref 6.5–8.1)

## 2022-10-05 NOTE — Progress Notes (Signed)
[facility] CONSULT NOTE  Patient Care Team: Marguarite Arbour, MD as PCP - General (Internal Medicine) Earna Coder, MD as Consulting Physician (Oncology)  CHIEF COMPLAINTS/PURPOSE OF CONSULTATION: Pancytopenia   HEMATOLOGY HISTORY  # PANCYTOPENIA [phone] platelets-  100 Hb-9-10 MCV- HIV/Hepatitis: Alcohol; OCT 2023-ultrasound abdomen is normal-does not show any evidence of cirrhosis or enlarged spleen.  The liver and the kidneys show cysts-which appear benign.   HISTORY OF PRESENTING ILLNESS: Alone. Ambulating.   Vincent Church 89 y.o.  male pleasant patient is here for a follow up of his pancytopenia with clinical concerns of MDS [no bone marrow yet]-   Patient complains of fatigue. Also complains of he has a "water head" and dizzy spells.   Currently resolved diarrhea in last 1 week ago.  States he doesn't feel right along the upper abdomen.   No appetite; lost weight 15 pounds in last 6 months.    He walks a couple miles every morning.   Plays golf once a week.  Denies any chest pain or shortness of breath or cough.  No fevers no chills.    Review of Systems  Constitutional:  Positive for malaise/fatigue and weight loss. Negative for chills, diaphoresis and fever.  HENT:  Negative for nosebleeds and sore throat.   Eyes:  Negative for double vision.  Respiratory:  Positive for shortness of breath. Negative for cough, hemoptysis, sputum production and wheezing.   Cardiovascular:  Negative for chest pain, palpitations, orthopnea and leg swelling.  Gastrointestinal:  Negative for abdominal pain, blood in stool, constipation, diarrhea, heartburn, melena, nausea and vomiting.  Genitourinary:  Negative for dysuria, frequency and urgency.  Musculoskeletal:  Positive for joint pain. Negative for back pain.  Skin: Negative.  Negative for itching and rash.  Neurological:  Negative for dizziness, tingling, focal weakness, weakness and headaches.   Endo/Heme/Allergies:  Does not bruise/bleed easily.  Psychiatric/Behavioral:  Negative for depression. The patient is not nervous/anxious and does not have insomnia.      MEDICAL HISTORY:  Past Medical History:  Diagnosis Date   Basal cell carcinoma 11/05/2012   R prox med knee   SCC (squamous cell carcinoma) 04/09/2018   right preauricular inf sideburn area, 2.0cm ant to sideburn   Squamous cell carcinoma of skin 11/03/2008   R cheek - SCCIS   Squamous cell carcinoma of skin 03/10/2009   L hand   Squamous cell carcinoma of skin 05/07/2013   L dorsal hand   Squamous cell carcinoma of skin 01/08/2019   R forearm   Squamous cell carcinoma of skin 02/10/2021   right prox volar forearm, treated [facility]    SURGICAL HISTORY: Past Surgical History:  Procedure Laterality Date   CHOLECYSTECTOMY      SOCIAL HISTORY: Social History   Socioeconomic History   Marital status: Widowed    Spouse name: Not on file   Number of children: Not on file   Years of education: Not on file   Highest education level: Not on file  Occupational History   Not on file  Tobacco Use   Smoking status: Former    Types: Cigarettes   Smokeless tobacco: Never  Vaping Use   Vaping status: Never Used  Substance and Sexual Activity   Alcohol use: Never   Drug use: Not on file   Sexual activity: Not on file  Other Topics Concern   Not on file  Social History Narrative   3-4 miles/day; quit smoking > 40 years; no alcohol.  Worked for [employer]. Lives in [place]; self.    Social Determinants of Health   Financial Resource Strain: Low Risk  (09/14/2022)   Received from [facility]   Overall Financial Resource Strain (CARDIA)    Difficulty of Paying Living Expenses: Not hard at all  Food Insecurity: No Food Insecurity (09/14/2022)   Received from [facility]   Hunger Vital Sign    Worried About Running Out of Food in the Last Year: Never true    Ran Out of Food in  the Last Year: Never true  Transportation Needs: No Transportation Needs (09/14/2022)   Received from [facility] - Transportation    In the past 12 months, has lack of transportation kept you from medical appointments or from getting medications?: No    Lack of Transportation (Non-Medical): No  Physical Activity: Not on file  Stress: Not on file  Social Connections: Not on file  Intimate Partner Violence: Not on file    FAMILY HISTORY: Family History  Problem Relation Age of Onset   Breast cancer Sister    Breast cancer Daughter    Colon cancer Daughter     ALLERGIES:  has No Known Allergies.  MEDICATIONS:  Current Outpatient Medications  Medication Sig Dispense Refill   Cholecalciferol 25 MCG (1000 UT) tablet Take by mouth.     Cinnamon 500 MG capsule Take by mouth.     cyanocobalamin 1000 MCG tablet Take by mouth.     lansoprazole (PREVACID) 30 MG capsule Take 30 mg by mouth daily.     Multiple Vitamins-Minerals (PRESERVISION AREDS 2) CAPS Take by mouth.     No current facility-administered medications for this visit.     PHYSICAL EXAMINATION:   Vitals:   10/05/22 0919  BP: 115/61  Pulse: 96  Temp: 97.7 F (36.5 C)  SpO2: 96%   Filed Weights   10/05/22 0919  Weight: 174 lb (78.9 kg)    Physical Exam Vitals and nursing note reviewed.  HENT:     Head: Normocephalic and atraumatic.     Mouth/Throat:     Pharynx: Oropharynx is clear.  Eyes:     Extraocular Movements: Extraocular movements intact.     Pupils: Pupils are equal, round, and reactive to light.  Cardiovascular:     Rate and Rhythm: Normal rate and regular rhythm.  Pulmonary:     Comments: Decreased breath sounds bilaterally.  Abdominal:     Palpations: Abdomen is soft.  Musculoskeletal:        General: Normal range of motion.     Cervical back: Normal range of motion.  Skin:    General: Skin is warm.  Neurological:     General: No focal deficit present.      Mental Status: He is alert and oriented to person, place, and time.  Psychiatric:        Behavior: Behavior normal.        Judgment: Judgment normal.      LABORATORY DATA:  I have reviewed the data as listed Lab Results  Component Value Date   WBC 3.1 (L) 10/05/2022   HGB 9.0 (L) 10/05/2022   HCT 29.1 (L) 10/05/2022   MCV 89.8 10/05/2022   PLT 145 (L) 10/05/2022   Recent Labs    04/06/22 0912 10/05/22 0926  NA 138 139  K 4.2 4.7  CL 107 104  CO2 26 27  GLUCOSE 126* 140*  BUN 25* 17  CREATININE  1.03 1.06  CALCIUM 9.0 9.7  GFRNONAA >60 >60  PROT 6.3* 7.0  ALBUMIN 3.9 3.5  AST 18 21  ALT 14 15  ALKPHOS 75 100  BILITOT 0.6 0.8     No results found.  ASSESSMENT & PLAN:   Symptomatic anemia # PANCYTOPENIA-[since  2013- Hemoglobin-10 ;  platelets 90-100]-clinically suggestive of MDS however bone marrow biopsy not done yet.  AUG 2023-work-up including iron deficiency B12; hepatitis/myeloma workup LFTs normal; OCT 2023- Ultrasound pending the etiology is unclear- over all stable.  # Patient continues to have mild anemia hemoglobin 9-10; mild leukopenia; normal differential-platelets greater than 100.  Given his weight loss/ fatigue- I discussed with the patient that bone marrow is recommended. Discussed treatment options of low-grade MDS includes-injections/pills- like revlimid.    I again reviewed malignant causes include: leukemias-low-grade MDS-it is still a possibility.  Discussed regarding a bone marrow biopsy. Discussed with the patient the bone marrow biopsy and aspiration indication and procedure at length.  Given significant discomfort involved-I would recommend under anesthesia/with radiology in the hospital. I discussed the potential complications include-bleeding/trauma and risk of infection; which are fortunately very rare.  Patient is in agreement. Patient will sign the consent prior to the procedure.  # Patient still reluctant with bone marrow biopsy. He will  inform us if and when he is interested.     Added epo # DISPOSITION: # follow up in 4  months MD; labs- cbc/cmp;LDH-- Dr.B    All questions were answered. The patient knows to call the clinic with any problems, questions or concerns.    Earna Coder, MD 10/05/2022 10:33 AM

## 2022-10-05 NOTE — Progress Notes (Signed)
Fatigue/weakness: yes Dyspena: yes Light headedness: yes Blood in stool: no  Pt states he feels like he has a "water head", dizzy since 09/14/22. Had some diarrhea around this time but has cleared. States he doesn't feel right along the upper abdomen.  No appetite, has 1-2 atkins drinks per day.  SOB with activity.

## 2022-10-05 NOTE — Assessment & Plan Note (Signed)
#   PANCYTOPENIA-[since  2013- Hemoglobin-10 ;  platelets 90-100]-clinically suggestive of MDS however bone marrow biopsy not done yet.  AUG 2023-work-up including iron deficiency B12; hepatitis/myeloma workup LFTs normal; OCT 2023- Ultrasound pending the etiology is unclear- over all stable.  # Patient continues to have mild anemia hemoglobin 9-10; mild leukopenia; normal differential-platelets greater than 100.  Given his weight loss/ fatigue- I discussed with the patient that bone marrow is recommended. Discussed treatment options of low-grade MDS includes-injections/pills- like revlimid.    I again reviewed malignant causes include: leukemias-low-grade MDS-it is still a possibility.  Discussed regarding a bone marrow biopsy. Discussed with the patient the bone marrow biopsy and aspiration indication and procedure at length.  Given significant discomfort involved-I would recommend under anesthesia/with radiology in the hospital. I discussed the potential complications include-bleeding/trauma and risk of infection; which are fortunately very rare.  Patient is in agreement. Patient will sign the consent prior to the procedure.  # Patient still reluctant with bone marrow biopsy. He will inform us if and when he is interested.     Added epo # DISPOSITION: # follow up in 4  months MD; labs- cbc/cmp;LDH-- Dr.B

## 2022-10-07 LAB — ERYTHROPOIETIN: Erythropoietin: 35.5 m[IU]/mL — ABNORMAL HIGH (ref 2.6–18.5)

## 2022-10-17 ENCOUNTER — Telehealth: Payer: Self-pay | Admitting: *Deleted

## 2022-10-17 ENCOUNTER — Telehealth: Payer: Self-pay | Admitting: Internal Medicine

## 2022-10-17 ENCOUNTER — Other Ambulatory Visit: Payer: Self-pay | Admitting: *Deleted

## 2022-10-17 DIAGNOSIS — D696 Thrombocytopenia, unspecified: Secondary | ICD-10-CM

## 2022-10-17 DIAGNOSIS — D649 Anemia, unspecified: Secondary | ICD-10-CM

## 2022-10-17 NOTE — Telephone Encounter (Signed)
Called pt to set up BM Bx date. Pt agreed to [facility] Oct 23 with arrival time of 7:30 am. Nothing to eat or drink after midnight and he will need a driver with him due to moderate sedation. [facility] ED. Pt understands above directions.

## 2022-10-17 NOTE — Telephone Encounter (Signed)
Pt agreeable to Bone Marrow Biopsy. Will proceed with getting that ordered and scheduled.

## 2022-10-17 NOTE — Telephone Encounter (Signed)
Patient came by to let us know he would like to proceed with the bone marrow biopsy. He is out of town 11/10-11/16. He would love to be able to get this done before then. Please call [phone] to schedule or [phone].

## 2022-10-24 ENCOUNTER — Emergency Department: Payer: Medicare Other

## 2022-10-24 ENCOUNTER — Emergency Department
Admission: EM | Admit: 2022-10-24 | Discharge: 2022-10-24 | Disposition: A | Discharge status: Home / Self Care | Payer: Medicare Other | Attending: Emergency Medicine | Admitting: Emergency Medicine

## 2022-10-24 ENCOUNTER — Other Ambulatory Visit: Payer: Self-pay | Admitting: Physician Assistant

## 2022-10-24 ENCOUNTER — Other Ambulatory Visit: Payer: Self-pay

## 2022-10-24 DIAGNOSIS — R5383 Other fatigue: Secondary | ICD-10-CM | POA: Diagnosis not present

## 2022-10-24 DIAGNOSIS — R000 Tachycardia, unspecified: Secondary | ICD-10-CM | POA: Insufficient documentation

## 2022-10-24 DIAGNOSIS — R0600 Dyspnea, unspecified: Secondary | ICD-10-CM | POA: Insufficient documentation

## 2022-10-24 DIAGNOSIS — R531 Weakness: Secondary | ICD-10-CM | POA: Diagnosis present

## 2022-10-24 DIAGNOSIS — Z85828 Personal history of other malignant neoplasm of skin: Secondary | ICD-10-CM | POA: Diagnosis not present

## 2022-10-24 DIAGNOSIS — J90 Pleural effusion, not elsewhere classified: Secondary | ICD-10-CM

## 2022-10-24 DIAGNOSIS — D61818 Other pancytopenia: Secondary | ICD-10-CM

## 2022-10-24 DIAGNOSIS — R918 Other nonspecific abnormal finding of lung field: Secondary | ICD-10-CM

## 2022-10-24 LAB — COMPREHENSIVE METABOLIC PANEL
ALT: 18 U/L (ref 0–44)
AST: 24 U/L (ref 15–41)
Albumin: 3.3 g/dL — ABNORMAL LOW (ref 3.5–5.0)
Alkaline Phosphatase: 137 U/L — ABNORMAL HIGH (ref 38–126)
Anion gap: 8 (ref 5–15)
BUN: 19 mg/dL (ref 8–23)
CO2: 27 mmol/L (ref 22–32)
Calcium: 9.5 mg/dL (ref 8.9–10.3)
Chloride: 101 mmol/L (ref 98–111)
Creatinine, Ser: 1.01 mg/dL (ref 0.61–1.24)
GFR, Estimated: >60 mL/min (ref >60)
Glucose, Bld: 150 mg/dL — ABNORMAL HIGH (ref 70–99)
Potassium: 4.4 mmol/L (ref 3.5–5.1)
Sodium: 136 mmol/L (ref 135–145)
Total Bilirubin: 1.3 mg/dL — ABNORMAL HIGH (ref 0.3–1.2)
Total Protein: 7.3 g/dL (ref 6.5–8.1)

## 2022-10-24 LAB — CBC WITH DIFFERENTIAL/PLATELET
Abs Immature Granulocytes: 0.04 10*3/uL (ref 0.00–0.07)
Basophils Absolute: 0 10*3/uL (ref 0.0–0.1)
Basophils Relative: 1 %
Eosinophils Absolute: 0 10*3/uL (ref 0.0–0.5)
Eosinophils Relative: 0 %
HCT: 32.7 % — ABNORMAL LOW (ref 39.0–52.0)
Hemoglobin: 10.3 g/dL — ABNORMAL LOW (ref 13.0–17.0)
Immature Granulocytes: 1 %
Lymphocytes Relative: 14 %
Lymphs Abs: 0.6 10*3/uL — ABNORMAL LOW (ref 0.7–4.0)
MCH: 27.4 pg (ref 26.0–34.0)
MCHC: 31.5 g/dL (ref 30.0–36.0)
MCV: 87 fL (ref 80.0–100.0)
Monocytes Absolute: 0.5 10*3/uL (ref 0.1–1.0)
Monocytes Relative: 11 %
Neutro Abs: 3.2 10*3/uL (ref 1.7–7.7)
Neutrophils Relative %: 73 %
Platelets: 157 10*3/uL (ref 150–400)
RBC: 3.76 MIL/uL — ABNORMAL LOW (ref 4.22–5.81)
RDW: 20.2 % — ABNORMAL HIGH (ref 11.5–15.5)
WBC: 4.4 10*3/uL (ref 4.0–10.5)
nRBC: 0 % (ref 0.0–0.2)

## 2022-10-24 LAB — TROPONIN I (HIGH SENSITIVITY): Troponin I (High Sensitivity): 5 ng/L (ref <18)

## 2022-10-24 MED ORDER — SODIUM CHLORIDE 0.9 % IV BOLUS
1000.0000 mL | Freq: Once | INTRAVENOUS | Status: AC
  Administered 2022-10-24: 1000 mL via INTRAVENOUS

## 2022-10-24 MED ORDER — IOHEXOL 300 MG/ML  SOLN
75.0000 mL | Freq: Once | INTRAMUSCULAR | Status: AC | PRN
  Administered 2022-10-24: 75 mL via INTRAVENOUS

## 2022-10-24 NOTE — ED Provider Triage Note (Signed)
Emergency Medicine Provider Triage Evaluation Note  Vincent Church , a 89 y.o. male  was evaluated in triage.  Pt complains of dizziness, weakness, weight loss, spots on lung per dr sparks, sent here by dr sparks, pt also states dr is suppose to do a bone marrow test next week.  Review of Systems  Positive:  Negative:   Physical Exam  BP 93/74   Pulse 67   Temp 97.9 F (36.6 C)   Resp 18   Wt 75.3 kg   SpO2 100%   BMI 22.51 kg/m  Gen:   Awake, no distress   Resp:  Normal effort  MSK:   Moves extremities without difficulty  Other:    Medical Decision Making  Medically screening exam initiated at 1:52 PM.  Appropriate orders placed.  Vincent Church was informed that the remainder of the evaluation will be completed by another provider, this initial triage assessment does not replace that evaluation, and the importance of remaining in the ED until their evaluation is complete.     Faythe Ghee, PA-C 10/24/22 1353

## 2022-10-24 NOTE — ED Provider Notes (Addendum)
[facility] Provider Note    Event Date/Time   First MD Initiated Contact with Patient 10/24/22 1825     (approximate)  History   Chief Complaint: Pneumonia  HPI  Vincent Church is a 89 y.o. male with a past medical history of squamous cell carcinoma who presents to the emergency department with various complaints.  According to the patient over the past 6 months or so he has had significant weight loss he has had no appetite he said generalized fatigue and weakness.  Patient has been following up with his physician Dr. Judithann Sheen who has referred him to oncology, he has a bone marrow biopsy scheduled for Wednesday for possible leukemia type diagnosis per patient.  Patient states also he was recently told on a chest x-ray that he had a lung nodule or mass they were not sure.  He completed a course of antibiotics for possible pneumonia to this area.  Patient denies any vomiting or diarrhea denies any abdominal pain.  Patient's major complaint is generalized weakness with decreased appetite and weight loss, although admits symptoms have been ongoing for approximately 6 months.  Physical Exam   Triage Vital Signs: ED Triage Vitals  Encounter Vitals Group     BP 10/24/22 1348 93/74     Systolic BP Percentile --      Diastolic BP Percentile --      Pulse Rate 10/24/22 1348 67     Resp 10/24/22 1348 18     Temp 10/24/22 1351 97.9 F (36.6 C)     Temp src --      SpO2 10/24/22 1351 100 %     Weight 10/24/22 1352 166 lb (75.3 kg)     Height --      Head Circumference --      Peak Flow --      Pain Score 10/24/22 1349 0     Pain Loc --      Pain Education --      Exclude from Growth Chart --     Most recent vital signs: Vitals:   10/24/22 1351 10/24/22 1815  BP:  (!) 121/55  Pulse:  (!) 102  Resp:  19  Temp: 97.9 F (36.6 C)   SpO2: 100% 96%    General: Awake, no distress.  CV:  Good peripheral perfusion.  Regular rate and rhythm  Resp:  Normal effort.   Equal breath sounds bilaterally.  Abd:  No distention.  Soft, nontender.  No rebound or guarding.  ED Results / Procedures / Treatments   EKG  EKG viewed and interpreted by myself shows sinus tachycardia at 111 bpm with a narrow QRS, normal axis, normal intervals, nonspecific ST changes.  RADIOLOGY  I have reviewed and interpreted CT of the chest images.  Patient appears to have a left-sided pleural effusion but I do not see any obvious mass or tumor awaiting radiology read.  Radiology is read the CT scan as extensive left supraclavicular adenopathy with multiple low-density necrotic appearing lymph nodes differential considerations include necrotic/cystic metastatic disease lymphoma or infection.   MEDICATIONS ORDERED IN ED: Medications  sodium chloride 0.9 % bolus 1,000 mL (1,000 mLs Intravenous New Bag/Given 10/24/22 1935)  iohexol (OMNIPAQUE) 300 MG/ML solution 75 mL (75 mLs Intravenous Contrast Given 10/24/22 1949)     IMPRESSION / MDM / ASSESSMENT AND PLAN / ED COURSE  I reviewed the triage vital signs and the nursing notes.  Patient's presentation is most consistent with acute presentation  with potential threat to life or bodily function.  Patient presents the emergency department for continued shortness of breath continued weakness and fatigue continued weight loss.  Patient is lab work today shows a reassuring CBC, reassuring chemistry, troponin is negative.  We will proceed with a CT scan of the chest with contrast to further evaluate.  Will IV hydrate and continue to closely monitor.  Patient agreeable to plan of care.  Lab work is reassuring including negative troponin.  Patient CT scan does show significant lymphadenopathy with several necrotic/cystic appearing lymph nodes concerning for possible metastatic disease or lymphoma.  Patient had a follow-up appointment on Wednesday with oncology for biopsy.    FINAL CLINICAL IMPRESSION(S) / ED DIAGNOSES    Weakness Fatigue Dyspnea  Note:  This document was prepared using Dragon voice recognition software and may include unintentional dictation errors.   Minna Antis, MD 10/24/22 3086    Minna Antis, MD 10/24/22 2326

## 2022-10-24 NOTE — ED Triage Notes (Signed)
Pt was seen by provider who diagnosed him with pneumonia and given antibiotics but also said he had "a spot on his lung" and needed to be seen. Has been having decreased appetite, loss of weight, dizziness. Denies blood in stool. Previous smoker.

## 2022-10-24 NOTE — Discharge Instructions (Signed)
As we discussed your workup today shows several enlarged lymph nodes throughout the chest.  This is concerning for possible cancerous cause.  Please follow-up with oncology on Wednesday as scheduled for your biopsy for further evaluation.  Return to the emergency department for any symptoms personally concerning to yourself.

## 2022-10-25 ENCOUNTER — Other Ambulatory Visit: Payer: Self-pay | Admitting: *Deleted

## 2022-10-25 ENCOUNTER — Other Ambulatory Visit (HOSPITAL_COMMUNITY): Payer: Self-pay | Admitting: Radiology

## 2022-10-25 ENCOUNTER — Other Ambulatory Visit: Payer: Self-pay | Admitting: Internal Medicine

## 2022-10-25 ENCOUNTER — Telehealth: Payer: Self-pay | Admitting: Internal Medicine

## 2022-10-25 DIAGNOSIS — D649 Anemia, unspecified: Secondary | ICD-10-CM

## 2022-10-25 DIAGNOSIS — R599 Enlarged lymph nodes, unspecified: Secondary | ICD-10-CM

## 2022-10-25 DIAGNOSIS — R590 Localized enlarged lymph nodes: Secondary | ICD-10-CM

## 2022-10-25 NOTE — Telephone Encounter (Signed)
I spoke to patient regarding result.  CT scan done in emergency room; also re: LN Bx. He in agreement.   Discussed with IR regarding axillary lymph node biopsy along the bone marrow biopsy- ordered infectious work up; flow; and surg path.  Lauren- please follow up on the results of the biopsy- and if concerning- pt will need to follow up sooner with X- provider [currently awaiting appt with me on 11/12].

## 2022-10-25 NOTE — Progress Notes (Signed)
Patient for IR Bone Marrow Biopsy & IR Core LT LN Biopsy on Wed 10/26/2022, I called and spoke with the patient on the phone and gave pre-procedure instructions. Pt was made aware to be here at 7:30a, NPO after MN prior to procedure as well as driver post procedure/recovery/discharge. Pt stated understanding.  Called 10/25/2022

## 2022-10-26 ENCOUNTER — Encounter: Payer: Self-pay | Admitting: Radiology

## 2022-10-26 ENCOUNTER — Ambulatory Visit
Admission: RE | Admit: 2022-10-26 | Discharge: 2022-10-26 | Discharge status: Home / Self Care | Payer: Medicare Other | Source: Ambulatory Visit | Attending: Internal Medicine | Admitting: Radiology

## 2022-10-26 ENCOUNTER — Other Ambulatory Visit: Payer: Self-pay

## 2022-10-26 ENCOUNTER — Ambulatory Visit
Admission: RE | Admit: 2022-10-26 | Discharge: 2022-10-26 | Disposition: A | Discharge status: Home / Self Care | Payer: Medicare Other | Source: Ambulatory Visit | Attending: Internal Medicine | Admitting: Internal Medicine

## 2022-10-26 DIAGNOSIS — D649 Anemia, unspecified: Secondary | ICD-10-CM | POA: Diagnosis present

## 2022-10-26 DIAGNOSIS — R599 Enlarged lymph nodes, unspecified: Secondary | ICD-10-CM | POA: Insufficient documentation

## 2022-10-26 DIAGNOSIS — D696 Thrombocytopenia, unspecified: Secondary | ICD-10-CM | POA: Diagnosis not present

## 2022-10-26 DIAGNOSIS — Z1379 Encounter for other screening for genetic and chromosomal anomalies: Secondary | ICD-10-CM | POA: Diagnosis not present

## 2022-10-26 HISTORY — PX: IR US LIVER BIOPSY: IMG936

## 2022-10-26 HISTORY — PX: IR BONE MARROW BIOPSY & ASPIRATION: IMG5727

## 2022-10-26 LAB — CBC WITH DIFFERENTIAL/PLATELET
Abs Immature Granulocytes: 0.04 10*3/uL (ref 0.00–0.07)
Basophils Absolute: 0 10*3/uL (ref 0.0–0.1)
Basophils Relative: 1 %
Eosinophils Absolute: 0 10*3/uL (ref 0.0–0.5)
Eosinophils Relative: 0 %
HCT: 30.5 % — ABNORMAL LOW (ref 39.0–52.0)
Hemoglobin: 9.7 g/dL — ABNORMAL LOW (ref 13.0–17.0)
Immature Granulocytes: 1 %
Lymphocytes Relative: 11 %
Lymphs Abs: 0.4 10*3/uL — ABNORMAL LOW (ref 0.7–4.0)
MCH: 27.8 pg (ref 26.0–34.0)
MCHC: 31.8 g/dL (ref 30.0–36.0)
MCV: 87.4 fL (ref 80.0–100.0)
Monocytes Absolute: 0.5 10*3/uL (ref 0.1–1.0)
Monocytes Relative: 12 %
Neutro Abs: 3.2 10*3/uL (ref 1.7–7.7)
Neutrophils Relative %: 75 %
Platelets: 136 10*3/uL — ABNORMAL LOW (ref 150–400)
RBC: 3.49 MIL/uL — ABNORMAL LOW (ref 4.22–5.81)
RDW: 20.5 % — ABNORMAL HIGH (ref 11.5–15.5)
WBC: 4.2 10*3/uL (ref 4.0–10.5)
nRBC: 0 % (ref 0.0–0.2)

## 2022-10-26 LAB — GLUCOSE, CAPILLARY: Glucose-Capillary: 146 mg/dL — ABNORMAL HIGH (ref 70–99)

## 2022-10-26 MED ORDER — LIDOCAINE HCL 1 % IJ SOLN
6.0000 mL | Freq: Once | INTRAMUSCULAR | Status: AC
  Administered 2022-10-26: 6 mL via INTRADERMAL

## 2022-10-26 MED ORDER — FENTANYL CITRATE (PF) 100 MCG/2ML IJ SOLN
INTRAMUSCULAR | Status: DC | PRN
  Administered 2022-10-26: 25 ug via INTRAVENOUS

## 2022-10-26 MED ORDER — FENTANYL CITRATE (PF) 100 MCG/2ML IJ SOLN
INTRAMUSCULAR | Status: AC
  Filled 2022-10-26: qty 2

## 2022-10-26 MED ORDER — LIDOCAINE 1 % OPTIME INJ - NO CHARGE
5.0000 mL | Freq: Once | INTRAMUSCULAR | Status: AC
  Administered 2022-10-26: 5 mL via INTRADERMAL
  Filled 2022-10-26: qty 6

## 2022-10-26 MED ORDER — HEPARIN SOD (PORK) LOCK FLUSH 100 UNIT/ML IV SOLN
INTRAVENOUS | Status: AC
  Filled 2022-10-26: qty 5

## 2022-10-26 MED ORDER — MIDAZOLAM HCL 2 MG/2ML IJ SOLN
INTRAMUSCULAR | Status: DC | PRN
  Administered 2022-10-26: .5 mg via INTRAVENOUS

## 2022-10-26 MED ORDER — LIDOCAINE HCL 1 % IJ SOLN
INTRAMUSCULAR | Status: AC
  Filled 2022-10-26: qty 20

## 2022-10-26 MED ORDER — MIDAZOLAM HCL 2 MG/2ML IJ SOLN
INTRAMUSCULAR | Status: AC
  Filled 2022-10-26: qty 2

## 2022-10-26 MED ORDER — SODIUM CHLORIDE 0.9 % IV SOLN: INTRAVENOUS | Status: DC

## 2022-10-26 NOTE — H&P (Signed)
Chief Complaint: Patient was seen in consultation today for anemia and lymphadenopathy  Referring Physician(s): Earna Coder  Supervising Physician: Pernell Dupre  Patient Status: [facility] - Out-pt  History of Present Illness: Vincent Church is a 89 y.o. male with PMH significant for squamous cell carcinoma being seen today in relation to symptomatic anemia and lymphadenopathy. Patient has been under the care of Dr Donneta Romberg from Hematology/Oncology. Patient with hemoglobin in 9-10 range and mild leukopenia. Work-up has been negative to date. Patient has been referred to IR for image-guided bone marrow biopsy to evaluate for myelodysplastic syndrome. Patient additionally presented to [facility] ED on 10/24/22 for shortness of breath and weakness. CT revealed lymphadenopathy concerning for possible metastatic disease. Lymph node biopsy is also requested for today.    Past Medical History:  Diagnosis Date   Basal cell carcinoma 11/05/2012   R prox med knee   SCC (squamous cell carcinoma) 04/09/2018   right preauricular inf sideburn area, 2.0cm ant to sideburn   Squamous cell carcinoma of skin 11/03/2008   R cheek - SCCIS   Squamous cell carcinoma of skin 03/10/2009   L hand   Squamous cell carcinoma of skin 05/07/2013   L dorsal hand   Squamous cell carcinoma of skin 01/08/2019   R forearm   Squamous cell carcinoma of skin 02/10/2021   right prox volar forearm, treated EDC    Past Surgical History:  Procedure Laterality Date   CHOLECYSTECTOMY      Allergies: Patient has no known allergies.  Medications: Prior to Admission medications   Medication Sig Start Date End Date Taking? Authorizing Provider  Cholecalciferol 25 MCG (1000 UT) tablet Take by mouth.    [provider]  Cinnamon 500 MG capsule Take by mouth.    [provider]  cyanocobalamin 1000 MCG tablet Take by mouth.    [provider]  lansoprazole (PREVACID) 30 MG capsule Take  30 mg by mouth daily. 08/16/21   [provider]  Multiple Vitamins-Minerals (PRESERVISION AREDS 2) CAPS Take by mouth.    [provider]     Family History  Problem Relation Age of Onset   Breast cancer Sister    Breast cancer Daughter    Colon cancer Daughter     Social History   Socioeconomic History   Marital status: Widowed    Spouse name: Not on file   Number of children: Not on file   Years of education: Not on file   Highest education level: Not on file  Occupational History   Not on file  Tobacco Use   Smoking status: Former    Types: Cigarettes   Smokeless tobacco: Never  Vaping Use   Vaping status: Never Used  Substance and Sexual Activity   Alcohol use: Never   Drug use: Not on file   Sexual activity: Not on file  Other Topics Concern   Not on file  Social History Narrative   3-4 miles/day; quit smoking > 40 years; no alcohol. Worked for [employer]. Lives in [place]; self.    Social Determinants of Health   Financial Resource Strain: Low Risk  (09/14/2022)   Received from [facility]   Overall Financial Resource Strain (CARDIA)    Difficulty of Paying Living Expenses: Not hard at all  Food Insecurity: No Food Insecurity (09/14/2022)   Received from [facility]   Hunger Vital Sign    Worried About Running Out of Food in the Last Year:  Never true    Ran Out of Food in the Last Year: Never true  Transportation Needs: No Transportation Needs (09/14/2022)   Received from [facility] - Transportation    In the past 12 months, has lack of transportation kept you from medical appointments or from getting medications?: No    Lack of Transportation (Non-Medical): No  Physical Activity: Not on file  Stress: Not on file  Social Connections: Not on file    Code Status: Full code  Review of Systems: A 12 point ROS discussed and pertinent positives are indicated in the HPI above.   All other systems are negative.  Review of Systems  Constitutional:  Negative for chills and fever.  Respiratory:  Positive for shortness of breath. Negative for chest tightness.   Cardiovascular:  Negative for chest pain and leg swelling.  Gastrointestinal:  Positive for abdominal pain, diarrhea and nausea. Negative for vomiting.  Neurological:  Positive for dizziness. Negative for headaches.  Psychiatric/Behavioral:  Negative for confusion.     Vital Signs: BP 130/62   Pulse (!) 102   Temp 97.8 F (36.6 C) (Oral)   Resp 20   Ht 6\' 1"  (1.854 m)   Wt 166 lb (75.3 kg)   SpO2 95%   BMI 21.90 kg/m     Physical Exam Vitals reviewed.  Constitutional:      General: He is not in acute distress.    Appearance: He is ill-appearing.  Cardiovascular:     Rate and Rhythm: Regular rhythm. Tachycardia present.     Pulses: Normal pulses.     Heart sounds: Normal heart sounds.  Pulmonary:     Effort: Pulmonary effort is normal.     Breath sounds: Normal breath sounds.  Abdominal:     Palpations: Abdomen is soft.     Tenderness: There is no abdominal tenderness.  Musculoskeletal:     Right lower leg: No edema.     Left lower leg: No edema.  Skin:    General: Skin is warm and dry.  Neurological:     Mental Status: He is alert and oriented to person, place, and time.  Psychiatric:        Mood and Affect: Mood normal.        Behavior: Behavior normal.        Thought Content: Thought content normal.        Judgment: Judgment normal.     Imaging: CT Chest W Contrast  Result Date: 10/24/2022 CLINICAL DATA:  Abnormal x-ray EXAM: CT CHEST WITH CONTRAST TECHNIQUE: Multidetector CT imaging of the chest was performed during intravenous contrast administration. RADIATION DOSE REDUCTION: This exam was performed according to the departmental dose-optimization program which includes automated exposure control, adjustment of the mA and/or kV according to patient size and/or use of iterative  reconstruction technique. CONTRAST:  75mL OMNIPAQUE IOHEXOL 300 MG/ML  SOLN COMPARISON:  Report 10/20/2022, images not available at the time of dictation FINDINGS: Cardiovascular: Mild aortic atherosclerosis. No aneurysm. Mild coronary vascular calcification. Normal cardiac size. No pericardial effusion. Mediastinum/Nodes: Midline trachea. No thyroid mass. Esophagus within normal limits. Multiple enlarged left supraclavicular nodes measuring up to 1.9 cm. Multiple enlarged left axillary lymph nodes measuring up to 2.4 cm. Extensive mediastinal and left hilar adenopathy with multiple low-density/necrotic appearing lymph nodes. Right paratracheal node measures 2.1 cm on series 2, image 74. Prevascular node or nodes measure 2.6 cm on series 2, image 75. Bulky necrotic appearing left hilar nodal conglomerate  measures 4.4 by 4 cm on series 2, image 92. Several enlarged left cardio phrenic lymph nodes measuring up to 1.7 cm on series 2, image 144. Lungs/Pleura: Moderate size left pleural effusion. No consolidation or pneumothorax. Emphysema. Upper Abdomen: No acute finding Musculoskeletal: No acute or suspicious osseous abnormality. IMPRESSION: 1. Extensive left supraclavicular, left axillary, mediastinal, left hilar, and left cardiophrenic adenopathy with multiple low-density/necrotic appearing lymph nodes. Differential considerations include necrotic/cystic metastatic disease, lymphoma, and infection to include tuberculous and non tuberculous infection as well as fungal disease. 2. Moderate size left pleural effusion. Aortic Atherosclerosis (ICD10-I70.0) and Emphysema (ICD10-J43.9). Electronically Signed   By: Jasmine Pang M.D.   On: 10/24/2022 22:49    Labs:  CBC: Recent Labs    04/06/22 0912 10/05/22 0925 10/24/22 1418  WBC 3.7* 3.1* 4.4  HGB 9.7* 9.0* 10.3*  HCT 29.7* 29.1* 32.7*  PLT 110* 145* 157    COAGS: No results for input(s): "INR", "APTT" in the last 8760 hours.  BMP: Recent Labs     04/06/22 0912 10/05/22 0926 10/24/22 1418  NA 138 139 136  K 4.2 4.7 4.4  CL 107 104 101  CO2 26 27 27   GLUCOSE 126* 140* 150*  BUN 25* 17 19  CALCIUM 9.0 9.7 9.5  CREATININE 1.03 1.06 1.01  GFRNONAA >60 >60 >60    LIVER FUNCTION TESTS: Recent Labs    04/06/22 0912 10/05/22 0926 10/24/22 1418  BILITOT 0.6 0.8 1.3*  AST 18 21 24   ALT 14 15 18   ALKPHOS 75 100 137*  PROT 6.3* 7.0 7.3  ALBUMIN 3.9 3.5 3.3*    TUMOR MARKERS: No results for input(s): "AFPTM", "CEA", "CA199", "CHROMGRNA" in the last 8760 hours.  Assessment and Plan:  Vincent Church is an 89 yo male being seen today in relation to symptomatic anemia and lymphadenopathy. Patient has been followed by Dr Donneta Romberg from Hematology/Oncology service. Patient referred to IR for image-guided bone marrow biopsy and for image-guided axillary lymph node biopsy. Imaging originally reviewed and approved by Dr Juliette Alcide. Case tentatively scheduled to proceed on 10/26/22. Patient presents today in their usual state of health and is NPO.  Risks and benefits of image-guided bone marrow biopsy and image-guided axillary lymph node biopsy were discussed with the patient and/or patient's family including, but not limited to bleeding, infection, damage to adjacent structures or low yield requiring additional tests.  All of the questions were answered and there is agreement to proceed.  Consent signed and in chart.   Thank you for this interesting consult.  I greatly enjoyed meeting Vincent Church and look forward to participating in their care.  A copy of this report was sent to the requesting provider on this date.  Electronically Signed: Kennieth Francois, PA-C 10/26/2022, 7:35 AM   I spent a total of  15 Minutes   in face to face in clinical consultation, greater than 50% of which was counseling/coordinating care for symptomatic anemia and lymphadenopathy.

## 2022-10-26 NOTE — Progress Notes (Signed)
Phase 2 complete at 1040.

## 2022-10-31 LAB — SURGICAL PATHOLOGY

## 2022-11-01 ENCOUNTER — Telehealth: Payer: Self-pay | Admitting: Nurse Practitioner

## 2022-11-01 DIAGNOSIS — R591 Generalized enlarged lymph nodes: Secondary | ICD-10-CM | POA: Insufficient documentation

## 2022-11-01 DIAGNOSIS — D46Z Other myelodysplastic syndromes: Secondary | ICD-10-CM | POA: Insufficient documentation

## 2022-11-01 NOTE — Telephone Encounter (Signed)
Spoke to patient by phone along with his granddaughter. He was feeling weaker yesterday and went to [facility] ER. He is now admitted to hospital. Imaging was again concerning for lymphadenopathy and pleural effusion. 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 Lymph node biopsy is still in process. Will follow up with patient once results available.

## 2022-11-02 ENCOUNTER — Telehealth: Payer: Self-pay | Admitting: Nurse Practitioner

## 2022-11-02 DIAGNOSIS — R591 Generalized enlarged lymph nodes: Secondary | ICD-10-CM

## 2022-11-02 NOTE — Telephone Encounter (Signed)
Lymph node biopsy is still pending but material is limited. Pathology from [facility] and [place] have been in contact and recommendation is for repeat biopsy while patient is admitted at [facility]. I spoke to [place], patient's son (Patient was unavailable) and he says [facility] is planning to perform biopsy in next couple of days.

## 2022-11-03 LAB — SURGICAL PATHOLOGY

## 2022-11-04 ENCOUNTER — Encounter: Payer: Self-pay | Admitting: Dermatology

## 2022-11-04 ENCOUNTER — Encounter (HOSPITAL_COMMUNITY): Payer: Self-pay

## 2022-11-10 ENCOUNTER — Ambulatory Visit: Payer: Medicare Other | Admitting: Dermatology

## 2022-11-11 ENCOUNTER — Encounter (HOSPITAL_COMMUNITY): Payer: Self-pay

## 2022-11-15 ENCOUNTER — Inpatient Hospital Stay: Payer: Medicare Other

## 2022-11-15 ENCOUNTER — Inpatient Hospital Stay: Payer: Medicare Other | Admitting: Internal Medicine

## 2022-11-17 ENCOUNTER — Encounter: Payer: Self-pay | Admitting: Internal Medicine

## 2022-11-28 ENCOUNTER — Telehealth: Payer: Self-pay

## 2022-11-28 NOTE — Telephone Encounter (Signed)
[facility] sent letter regarding BX patient had while admitted 11/15/22. Copy of the letter placed in your box for review. aw

## 2022-11-29 NOTE — Telephone Encounter (Signed)
Left VM on cell number listed. aw

## 2023-01-04 DEATH — deceased

## 2023-02-08 ENCOUNTER — Other Ambulatory Visit: Payer: Medicare Other

## 2023-02-08 ENCOUNTER — Ambulatory Visit: Payer: Medicare Other | Admitting: Internal Medicine

## 2023-03-22 ENCOUNTER — Ambulatory Visit: Payer: Medicare Other | Admitting: Dermatology
